# Patient Record
Sex: Female | Born: 1965
Health system: Southern US, Community
[De-identification: ages and names within clinical notes are randomized; demographics above are authoritative.]

## PROBLEM LIST (undated history)

## (undated) DIAGNOSIS — R7611 Nonspecific reaction to tuberculin skin test without active tuberculosis: Secondary | ICD-10-CM

## (undated) DIAGNOSIS — I10 Essential (primary) hypertension: Secondary | ICD-10-CM

## (undated) DIAGNOSIS — G4700 Insomnia, unspecified: Secondary | ICD-10-CM

## (undated) DIAGNOSIS — M5030 Other cervical disc degeneration, unspecified cervical region: Secondary | ICD-10-CM

## (undated) DIAGNOSIS — F331 Major depressive disorder, recurrent, moderate: Secondary | ICD-10-CM

## (undated) DIAGNOSIS — F419 Anxiety disorder, unspecified: Secondary | ICD-10-CM

## (undated) DIAGNOSIS — R413 Other amnesia: Secondary | ICD-10-CM

## (undated) DIAGNOSIS — M329 Systemic lupus erythematosus, unspecified: Secondary | ICD-10-CM

## (undated) DIAGNOSIS — M069 Rheumatoid arthritis, unspecified: Secondary | ICD-10-CM

## (undated) DIAGNOSIS — K5909 Other constipation: Secondary | ICD-10-CM

## (undated) DIAGNOSIS — T7840XA Allergy, unspecified, initial encounter: Secondary | ICD-10-CM

## (undated) DIAGNOSIS — K219 Gastro-esophageal reflux disease without esophagitis: Secondary | ICD-10-CM

## (undated) DIAGNOSIS — D649 Anemia, unspecified: Secondary | ICD-10-CM

## (undated) DIAGNOSIS — B360 Pityriasis versicolor: Secondary | ICD-10-CM

## (undated) HISTORY — DX: Gastro-esophageal reflux disease without esophagitis: K21.9

## (undated) HISTORY — DX: Other cervical disc degeneration, unspecified cervical region: M50.30

## (undated) HISTORY — DX: Insomnia, unspecified: G47.00

## (undated) HISTORY — DX: Systemic lupus erythematosus, unspecified: M32.9

## (undated) HISTORY — PX: RENAL BIOPSY: SHX156

## (undated) HISTORY — DX: Other constipation: K59.09

## (undated) HISTORY — DX: Pityriasis versicolor: B36.0

## (undated) HISTORY — DX: Major depressive disorder, recurrent, moderate: F33.1

## (undated) HISTORY — DX: Nonspecific reaction to tuberculin skin test without active tuberculosis: R76.11

## (undated) HISTORY — PX: UPPER GASTROINTESTINAL ENDOSCOPY: SHX188

## (undated) HISTORY — DX: Anxiety disorder, unspecified: F41.9

## (undated) HISTORY — PX: TONSILLECTOMY: SUR1361

## (undated) HISTORY — DX: Allergy, unspecified, initial encounter: T78.40XA

## (undated) HISTORY — DX: Other amnesia: R41.3

---

## 1998-08-25 DIAGNOSIS — A159 Respiratory tuberculosis unspecified: Secondary | ICD-10-CM

## 1998-08-25 DIAGNOSIS — G039 Meningitis, unspecified: Secondary | ICD-10-CM

## 1998-08-25 HISTORY — DX: Meningitis, unspecified: G03.9

## 1998-08-25 HISTORY — DX: Respiratory tuberculosis unspecified: A15.9

## 2005-01-30 ENCOUNTER — Other Ambulatory Visit: Admission: RE | Admit: 2005-01-30 | Discharge: 2005-01-30 | Payer: Self-pay | Admitting: Gynecology

## 2005-05-16 ENCOUNTER — Emergency Department (HOSPITAL_COMMUNITY): Admission: EM | Admit: 2005-05-16 | Discharge: 2005-05-16 | Payer: Self-pay | Admitting: Emergency Medicine

## 2005-07-07 ENCOUNTER — Emergency Department (HOSPITAL_COMMUNITY): Admission: EM | Admit: 2005-07-07 | Discharge: 2005-07-07 | Payer: Self-pay | Admitting: *Deleted

## 2007-08-26 HISTORY — PX: RENAL BIOPSY: SHX156

## 2009-12-18 HISTORY — PX: UPPER GASTROINTESTINAL ENDOSCOPY: SHX188

## 2012-03-23 ENCOUNTER — Ambulatory Visit: Payer: Medicaid Other | Attending: Neurology | Admitting: Rehabilitation

## 2012-03-23 DIAGNOSIS — M2560 Stiffness of unspecified joint, not elsewhere classified: Secondary | ICD-10-CM | POA: Insufficient documentation

## 2012-03-23 DIAGNOSIS — IMO0001 Reserved for inherently not codable concepts without codable children: Secondary | ICD-10-CM | POA: Insufficient documentation

## 2012-03-23 DIAGNOSIS — M2550 Pain in unspecified joint: Secondary | ICD-10-CM | POA: Insufficient documentation

## 2012-03-23 DIAGNOSIS — R293 Abnormal posture: Secondary | ICD-10-CM | POA: Insufficient documentation

## 2012-03-29 ENCOUNTER — Ambulatory Visit: Payer: Medicaid Other | Attending: Neurology | Admitting: Physical Therapy

## 2012-03-29 DIAGNOSIS — R293 Abnormal posture: Secondary | ICD-10-CM | POA: Insufficient documentation

## 2012-03-29 DIAGNOSIS — M2550 Pain in unspecified joint: Secondary | ICD-10-CM | POA: Insufficient documentation

## 2012-03-29 DIAGNOSIS — IMO0001 Reserved for inherently not codable concepts without codable children: Secondary | ICD-10-CM | POA: Insufficient documentation

## 2012-03-29 DIAGNOSIS — M2560 Stiffness of unspecified joint, not elsewhere classified: Secondary | ICD-10-CM | POA: Insufficient documentation

## 2012-04-13 ENCOUNTER — Ambulatory Visit: Payer: Medicaid Other | Admitting: Rehabilitation

## 2012-05-04 ENCOUNTER — Ambulatory Visit: Payer: Medicaid Other | Attending: Neurology | Admitting: Rehabilitation

## 2012-05-04 DIAGNOSIS — M2550 Pain in unspecified joint: Secondary | ICD-10-CM | POA: Insufficient documentation

## 2012-05-04 DIAGNOSIS — IMO0001 Reserved for inherently not codable concepts without codable children: Secondary | ICD-10-CM | POA: Insufficient documentation

## 2012-05-04 DIAGNOSIS — M2560 Stiffness of unspecified joint, not elsewhere classified: Secondary | ICD-10-CM | POA: Insufficient documentation

## 2012-05-04 DIAGNOSIS — R293 Abnormal posture: Secondary | ICD-10-CM | POA: Insufficient documentation

## 2013-04-06 ENCOUNTER — Other Ambulatory Visit: Payer: Self-pay | Admitting: Internal Medicine

## 2013-04-06 DIAGNOSIS — Z1231 Encounter for screening mammogram for malignant neoplasm of breast: Secondary | ICD-10-CM

## 2013-06-08 ENCOUNTER — Emergency Department (HOSPITAL_COMMUNITY)
Admission: EM | Admit: 2013-06-08 | Discharge: 2013-06-08 | Disposition: A | Discharge status: Home / Self Care | Payer: PRIVATE HEALTH INSURANCE | Attending: Emergency Medicine | Admitting: Emergency Medicine

## 2013-06-08 ENCOUNTER — Emergency Department (HOSPITAL_COMMUNITY): Payer: PRIVATE HEALTH INSURANCE

## 2013-06-08 ENCOUNTER — Encounter (HOSPITAL_COMMUNITY): Payer: Self-pay | Admitting: Emergency Medicine

## 2013-06-08 DIAGNOSIS — F329 Major depressive disorder, single episode, unspecified: Secondary | ICD-10-CM | POA: Insufficient documentation

## 2013-06-08 DIAGNOSIS — Z8739 Personal history of other diseases of the musculoskeletal system and connective tissue: Secondary | ICD-10-CM | POA: Insufficient documentation

## 2013-06-08 DIAGNOSIS — M069 Rheumatoid arthritis, unspecified: Secondary | ICD-10-CM | POA: Insufficient documentation

## 2013-06-08 DIAGNOSIS — R0789 Other chest pain: Secondary | ICD-10-CM

## 2013-06-08 DIAGNOSIS — R071 Chest pain on breathing: Secondary | ICD-10-CM | POA: Insufficient documentation

## 2013-06-08 DIAGNOSIS — F3289 Other specified depressive episodes: Secondary | ICD-10-CM | POA: Insufficient documentation

## 2013-06-08 DIAGNOSIS — Z87891 Personal history of nicotine dependence: Secondary | ICD-10-CM | POA: Insufficient documentation

## 2013-06-08 DIAGNOSIS — Z880 Allergy status to penicillin: Secondary | ICD-10-CM | POA: Insufficient documentation

## 2013-06-08 DIAGNOSIS — Z3202 Encounter for pregnancy test, result negative: Secondary | ICD-10-CM | POA: Insufficient documentation

## 2013-06-08 DIAGNOSIS — Z79899 Other long term (current) drug therapy: Secondary | ICD-10-CM | POA: Insufficient documentation

## 2013-06-08 HISTORY — DX: Rheumatoid arthritis, unspecified: M06.9

## 2013-06-08 HISTORY — DX: Systemic lupus erythematosus, unspecified: M32.9

## 2013-06-08 LAB — CBC
Hemoglobin: 11.6 g/dL — ABNORMAL LOW (ref 12.0–15.0)
MCH: 28 pg (ref 26.0–34.0)
MCHC: 32.4 g/dL (ref 30.0–36.0)
Platelets: 166 10*3/uL (ref 150–400)
RDW: 13.6 % (ref 11.5–15.5)
WBC: 6.1 10*3/uL (ref 4.0–10.5)

## 2013-06-08 LAB — BASIC METABOLIC PANEL
CO2: 26 mEq/L (ref 19–32)
Calcium: 9.5 mg/dL (ref 8.4–10.5)
GFR calc Af Amer: >90 mL/min (ref >90)
GFR calc non Af Amer: >90 mL/min (ref >90)
Glucose, Bld: 77 mg/dL (ref 70–99)
Potassium: 3.9 mEq/L (ref 3.5–5.1)
Sodium: 136 mEq/L (ref 135–145)

## 2013-06-08 LAB — POCT PREGNANCY, URINE: Preg Test, Ur: NEGATIVE

## 2013-06-08 LAB — POCT I-STAT TROPONIN I

## 2013-06-08 MED ORDER — MORPHINE SULFATE 4 MG/ML IJ SOLN
4.0000 mg | Freq: Once | INTRAMUSCULAR | Status: AC
  Administered 2013-06-08: 4 mg via INTRAVENOUS
  Filled 2013-06-08: qty 1

## 2013-06-08 MED ORDER — IBUPROFEN 800 MG PO TABS: 800.0000 mg | ORAL_TABLET | Freq: Three times a day (TID) | ORAL | Status: DC

## 2013-06-08 MED ORDER — HYDROCODONE-ACETAMINOPHEN 5-325 MG PO TABS: 1.0000 | ORAL_TABLET | ORAL | Status: DC | PRN

## 2013-06-08 NOTE — ED Notes (Signed)
Pt states chest pain starting yesterday with progressing severity.  No also has pain in right shoulder blade.  Pt states some increase in pain with movement.

## 2013-06-08 NOTE — ED Notes (Signed)
Pt stated being under a lot of stress, pain started last night, she took ibuprofen and minimal relief. Denies any N/V, dizziness and SOB. HX of lupus, RA.

## 2013-06-08 NOTE — ED Provider Notes (Signed)
Medical screening examination/treatment/procedure(s) were performed by non-physician practitioner and as supervising physician I was immediately available for consultation/collaboration.  Sly Parlee L Saretta Dahlem, MD 06/08/13 2335 

## 2013-06-08 NOTE — ED Provider Notes (Signed)
CSN: [id number]     Arrival date & time 06/08/13  1710 History   First MD Initiated Contact with Patient 06/08/13 2055     Chief Complaint  Patient presents with  . Chest Pain   (Consider location/radiation/quality/duration/timing/severity/associated sxs/prior Treatment) Patient is a 47 y.o. female presenting with chest pain. The history is provided by the patient. No language interpreter was used.  Chest Pain Pain location:  Substernal area Pain quality: aching   Pain radiates to:  R arm Pain radiates to the back: no   Pain severity:  Moderate Onset quality:  Gradual Associated symptoms: no cough, no fever, no nausea and no weakness   Associated symptoms comment:  Chest pain that started yesterday and has been progressive in intensity and constant. It is worse with movement. She denies significant shortness of breath, cough, fever, or nausea. No known injury.    Past Medical History  Diagnosis Date  . RA (rheumatoid arthritis)   . Depression   . Lupus    Past Surgical History  Procedure Laterality Date  . Renal biopsy Left 5 years prior   No family history on file. History  Substance Use Topics  . Smoking status: Former Smoker    Quit date: 06/09/2011  . Smokeless tobacco: Not on file  . Alcohol Use: 1.2 oz/week    2 Glasses of wine per week   OB History   Grav Para Term Preterm Abortions TAB SAB Ect Mult Living                 Review of Systems  Constitutional: Negative for fever and chills.  HENT: Negative.   Respiratory: Negative.  Negative for cough.   Cardiovascular: Positive for chest pain. Negative for leg swelling.  Gastrointestinal: Negative.  Negative for nausea.  Genitourinary: Negative.  Negative for dysuria.  Musculoskeletal: Negative.   Skin: Negative.   Neurological: Negative.  Negative for weakness.    Allergies  Peanuts; Penicillins; Soy allergy; and Sulfa antibiotics  Home Medications   Current Outpatient Rx  Name  Route  Sig  Dispense   Refill  . desvenlafaxine (PRISTIQ) 100 MG 24 hr tablet   Oral   Take 100 mg by mouth daily.         Marland Kitchen gabapentin (NEURONTIN) 300 MG capsule   Oral   Take 300 mg by mouth 3 (three) times daily.         Marland Kitchen ibuprofen (ADVIL,MOTRIN) 200 MG tablet   Oral   Take 400 mg by mouth every 6 (six) hours as needed for pain.         . methotrexate (RHEUMATREX) 2.5 MG tablet   Oral   Take 15 mg by mouth once a week. Caution:Chemotherapy. Protect from light.         . montelukast (SINGULAIR) 10 MG tablet   Oral   Take 10 mg by mouth at bedtime.         . traZODone (DESYREL) 100 MG tablet   Oral   Take 100 mg by mouth at bedtime.          BP 120/80  Pulse 66  Temp(Src) 97.6 F (36.4 C) (Oral)  Resp 13  SpO2 100%  LMP 05/14/2013 Physical Exam  Constitutional: She is oriented to person, place, and time. She appears well-developed and well-nourished.  HENT:  Head: Normocephalic.  Neck: Normal range of motion. Neck supple.  Cardiovascular: Normal rate and regular rhythm.   Pulmonary/Chest: Effort normal and breath sounds normal. She exhibits  tenderness.  Abdominal: Soft. Bowel sounds are normal. There is no tenderness. There is no rebound and no guarding.  Musculoskeletal: Normal range of motion. She exhibits no edema.  Neurological: She is alert and oriented to person, place, and time.  Skin: Skin is warm and dry. No rash noted.  Psychiatric: She has a normal mood and affect.    ED Course  Procedures (including critical care time) Labs Review Labs Reviewed  CBC - Abnormal; Notable for the following:    Hemoglobin 11.6 (*)    HCT 35.8 (*)    All other components within normal limits  BASIC METABOLIC PANEL  POCT I-STAT TROPONIN I  POCT I-STAT TROPONIN I  POCT PREGNANCY, URINE   Results for orders placed during the hospital encounter of 06/08/13  CBC      Result Value Range   WBC 6.1  4.0 - 10.5 K/uL   RBC 4.15  3.87 - 5.11 MIL/uL   Hemoglobin 11.6 (*) 12.0 -  15.0 g/dL   HCT 56.2 (*) 13.0 - 86.5 %   MCV 86.3  78.0 - 100.0 fL   MCH 28.0  26.0 - 34.0 pg   MCHC 32.4  30.0 - 36.0 g/dL   RDW 78.4  69.6 - 29.5 %   Platelets 166  150 - 400 K/uL  BASIC METABOLIC PANEL      Result Value Range   Sodium 136  135 - 145 mEq/L   Potassium 3.9  3.5 - 5.1 mEq/L   Chloride 102  96 - 112 mEq/L   CO2 26  19 - 32 mEq/L   Glucose, Bld 77  70 - 99 mg/dL   BUN 14  6 - 23 mg/dL   Creatinine, Ser 2.84  0.50 - 1.10 mg/dL   Calcium 9.5  8.4 - 13.2 mg/dL   GFR calc non Af Amer >90  >90 mL/min   GFR calc Af Amer >90  >90 mL/min  POCT I-STAT TROPONIN I      Result Value Range   Troponin i, poc 0.00  0.00 - 0.08 ng/mL   Comment 3           POCT I-STAT TROPONIN I      Result Value Range   Troponin i, poc 0.00  0.00 - 0.08 ng/mL   Comment 3           POCT PREGNANCY, URINE      Result Value Range   Preg Test, Ur NEGATIVE  NEGATIVE    Imaging Review Dg Chest 2 View  06/08/2013   CLINICAL DATA:  chest pain  EXAM: CHEST  2 VIEW  COMPARISON:  Report of prior chest radiograph May 09, 2011 available. Actual images from that study not available.  FINDINGS: Lungs clear. Heart size and pulmonary vascularity are normal. No adenopathy. No pneumothorax. No bone lesions.  IMPRESSION: No abnormality noted.   Electronically Signed   By: Bretta Bang M.D.   On: 06/08/2013 18:34    EKG Interpretation     Ventricular Rate:  78 PR Interval:  140 QRS Duration: 74 QT Interval:  370 QTC Calculation: 421 R Axis:   82 Text Interpretation:  Normal sinus rhythm Normal ECG No old tracing to compare            MDM  No diagnosis found. 1. Chest wall pain  Patient has very low risk for cardiac disease (no FH, nonsmoker, no h/o HTN or cholesterol) and pain is atypical and very reproducible on exam.  Normal vitals.  Neg lab studies including troponin x 2. Stable for discharge.     Arnoldo Hooker, PA-C 06/08/13 2224  Arnoldo Hooker, PA-C 06/08/13 2239

## 2013-10-17 ENCOUNTER — Emergency Department (INDEPENDENT_AMBULATORY_CARE_PROVIDER_SITE_OTHER)
Admission: EM | Admit: 2013-10-17 | Discharge: 2013-10-17 | Disposition: A | Discharge status: Other Acute Inpatient Hospital | Payer: PRIVATE HEALTH INSURANCE | Source: Home / Self Care | Attending: Family Medicine | Admitting: Family Medicine

## 2013-10-17 ENCOUNTER — Encounter (HOSPITAL_COMMUNITY): Payer: Self-pay | Admitting: Emergency Medicine

## 2013-10-17 DIAGNOSIS — M069 Rheumatoid arthritis, unspecified: Secondary | ICD-10-CM | POA: Insufficient documentation

## 2013-10-17 DIAGNOSIS — F3289 Other specified depressive episodes: Secondary | ICD-10-CM | POA: Insufficient documentation

## 2013-10-17 DIAGNOSIS — Z8661 Personal history of infections of the central nervous system: Secondary | ICD-10-CM

## 2013-10-17 DIAGNOSIS — F329 Major depressive disorder, single episode, unspecified: Secondary | ICD-10-CM | POA: Insufficient documentation

## 2013-10-17 DIAGNOSIS — Z79899 Other long term (current) drug therapy: Secondary | ICD-10-CM | POA: Insufficient documentation

## 2013-10-17 DIAGNOSIS — R519 Headache, unspecified: Secondary | ICD-10-CM

## 2013-10-17 DIAGNOSIS — R51 Headache: Secondary | ICD-10-CM

## 2013-10-17 DIAGNOSIS — Z880 Allergy status to penicillin: Secondary | ICD-10-CM | POA: Insufficient documentation

## 2013-10-17 DIAGNOSIS — Z8739 Personal history of other diseases of the musculoskeletal system and connective tissue: Secondary | ICD-10-CM | POA: Insufficient documentation

## 2013-10-17 DIAGNOSIS — Z3202 Encounter for pregnancy test, result negative: Secondary | ICD-10-CM | POA: Insufficient documentation

## 2013-10-17 DIAGNOSIS — IMO0002 Reserved for concepts with insufficient information to code with codable children: Secondary | ICD-10-CM | POA: Insufficient documentation

## 2013-10-17 DIAGNOSIS — R000 Tachycardia, unspecified: Secondary | ICD-10-CM | POA: Insufficient documentation

## 2013-10-17 DIAGNOSIS — Z87891 Personal history of nicotine dependence: Secondary | ICD-10-CM | POA: Insufficient documentation

## 2013-10-17 LAB — COMPREHENSIVE METABOLIC PANEL
ALBUMIN: 4.2 g/dL (ref 3.5–5.2)
ALK PHOS: 76 U/L (ref 39–117)
ALT: 18 U/L (ref 0–35)
AST: 20 U/L (ref 0–37)
BILIRUBIN TOTAL: 0.2 mg/dL — AB (ref 0.3–1.2)
BUN: 18 mg/dL (ref 6–23)
CO2: 28 mEq/L (ref 19–32)
Calcium: 9.7 mg/dL (ref 8.4–10.5)
Chloride: 99 mEq/L (ref 96–112)
Creatinine, Ser: 0.74 mg/dL (ref 0.50–1.10)
GFR calc Af Amer: >90 mL/min (ref >90)
GFR calc non Af Amer: >90 mL/min (ref >90)
Glucose, Bld: 102 mg/dL — ABNORMAL HIGH (ref 70–99)
Potassium: 4.3 mEq/L (ref 3.7–5.3)
SODIUM: 139 meq/L (ref 137–147)
TOTAL PROTEIN: 8.2 g/dL (ref 6.0–8.3)

## 2013-10-17 LAB — CBC WITH DIFFERENTIAL/PLATELET
BASOS PCT: 0 % (ref 0–1)
Basophils Absolute: 0 10*3/uL (ref 0.0–0.1)
EOS ABS: 0 10*3/uL (ref 0.0–0.7)
Eosinophils Relative: 0 % (ref 0–5)
HCT: 39.4 % (ref 36.0–46.0)
Hemoglobin: 12.9 g/dL (ref 12.0–15.0)
Lymphocytes Relative: 11 % — ABNORMAL LOW (ref 12–46)
Lymphs Abs: 1.3 10*3/uL (ref 0.7–4.0)
MCH: 29 pg (ref 26.0–34.0)
MCHC: 32.7 g/dL (ref 30.0–36.0)
MCV: 88.5 fL (ref 78.0–100.0)
Monocytes Absolute: 0.5 10*3/uL (ref 0.1–1.0)
Monocytes Relative: 5 % (ref 3–12)
NEUTROS ABS: 9.6 10*3/uL — AB (ref 1.7–7.7)
NEUTROS PCT: 84 % — AB (ref 43–77)
Platelets: 170 10*3/uL (ref 150–400)
RBC: 4.45 MIL/uL (ref 3.87–5.11)
RDW: 13.4 % (ref 11.5–15.5)
WBC: 11.4 10*3/uL — ABNORMAL HIGH (ref 4.0–10.5)

## 2013-10-17 LAB — URINALYSIS, ROUTINE W REFLEX MICROSCOPIC
BILIRUBIN URINE: NEGATIVE
GLUCOSE, UA: NEGATIVE mg/dL
HGB URINE DIPSTICK: NEGATIVE
KETONES UR: NEGATIVE mg/dL
Leukocytes, UA: NEGATIVE
Nitrite: NEGATIVE
PH: 7 (ref 5.0–8.0)
Protein, ur: NEGATIVE mg/dL
SPECIFIC GRAVITY, URINE: 1.025 (ref 1.005–1.030)
Urobilinogen, UA: 1 mg/dL (ref 0.0–1.0)

## 2013-10-17 LAB — POC URINE PREG, ED: Preg Test, Ur: NEGATIVE

## 2013-10-17 NOTE — ED Provider Notes (Signed)
Bonnie Farmer is a 48 y.o. female who presents to Urgent Care today for headache. Patient has a past medical history significant for meningitis, rheumatoid arthritis, and lupus. She developed a severe headache this morning that has persisted. It is associated with mild photophobia. The headache is severe it consistent with prior episodes of meningitis. 10 years ago she developed a viral meningitis from an unknown source possibly to be related to a mosquito borne illness from [place]. Additionally with her severe headache she has developed at the aches, neck pain and stiffness. She denies any weakness or numbness or loss of function disc coordination etc. She subjectively feels like she is thinking well. She denies any nausea vomiting or diarrhea. She does note a mild fever in addition to her headache and neck stiffness. She took 2 Aleve and 2 ibuprofen this afternoon.    Past Medical History  Diagnosis Date  . RA (rheumatoid arthritis)   . Depression   . Lupus    History  Substance Use Topics  . Smoking status: Former Smoker    Quit date: 06/09/2011  . Smokeless tobacco: Not on file  . Alcohol Use: 1.2 oz/week    2 Glasses of wine per week   ROS as above Medications: No current facility-administered medications for this encounter.   Current Outpatient Prescriptions  Medication Sig Dispense Refill  . desvenlafaxine (PRISTIQ) 100 MG 24 hr tablet Take 100 mg by mouth daily.      Marland Kitchen gabapentin (NEURONTIN) 300 MG capsule Take 300 mg by mouth 3 (three) times daily.      Marland Kitchen ibuprofen (ADVIL,MOTRIN) 200 MG tablet Take 400 mg by mouth every 6 (six) hours as needed for pain.      Marland Kitchen ibuprofen (ADVIL,MOTRIN) 800 MG tablet Take 1 tablet (800 mg total) by mouth 3 (three) times daily.  21 tablet  0  . montelukast (SINGULAIR) 10 MG tablet Take 10 mg by mouth at bedtime.      Marland Kitchen HYDROcodone-acetaminophen (NORCO/VICODIN) 5-325 MG per tablet Take 1-2 tablets by mouth every 4 (four) hours as needed for pain.   10 tablet  0  . methotrexate (RHEUMATREX) 2.5 MG tablet Take 15 mg by mouth once a week. Caution:Chemotherapy. Protect from light.      . traZODone (DESYREL) 100 MG tablet Take 100 mg by mouth at bedtime.        Exam:  BP 117/76  Pulse 67  Temp(Src) 99.2 F (37.3 C) (Oral)  Resp 16  SpO2 98%  LMP 10/08/2013 Gen: Well NAD HEENT: EOMI,  MMM PERRLA. Funduscopic exam is normal bilaterally. Exam is slightly degraded by cataract flare Neck: Nontender to spinal midline. Tender palpation right trapezius. Decreased flexion secondary to pain. Normal rotation and extension. Negative Kernig sign. Lungs: Normal work of breathing. CTABL Heart: RRR no MRG Abd: NABS, Soft. NT, ND Exts: Brisk capillary refill, warm and well perfused.  Neuro: Alert and oriented. Cranial nerves are intact. Normal balance and coordination. Sensation and strength are intact. Normal gait.   Assessment and Plan: 48 y.o. female with headache, neck stiffness, and mild fever. Patient has a history of viral meningitis and is immunocompromised due to prednisone and methotrexate secondary to rheumatoid arthritis.  Her fever is mild however this may be suppressed given her recent NSAID use. Her neck stiffness is probably more related to muscular dysfunction then meningeal signs over unable to fully assess this at the [facility] urgent.  I believe the patient needs more evaluation and management they were able  to perform at the urgent care setting. Transfer to the emergency room via shuttle.      Gregor Hams, MD 10/17/13 [phone]

## 2013-10-17 NOTE — ED Notes (Signed)
Pt reports headache, joint pain, limited movement in neck due to pain, and fever starting last night. Pt was seen at urgent care and sent here for further evaluation. Pt has hx of meningitis 20yrs ago. Pt last took ibuprofen at 10am. Denies light sensitivity.

## 2013-10-17 NOTE — ED Notes (Signed)
C/o onset this AM of headache that will not go away, fever, pain in neck and joints. History of RA and meningitis

## 2013-10-18 ENCOUNTER — Emergency Department (HOSPITAL_COMMUNITY): Payer: PRIVATE HEALTH INSURANCE

## 2013-10-18 ENCOUNTER — Emergency Department (HOSPITAL_COMMUNITY)
Admission: EM | Admit: 2013-10-18 | Discharge: 2013-10-18 | Disposition: A | Discharge status: Home / Self Care | Payer: PRIVATE HEALTH INSURANCE | Attending: Emergency Medicine | Admitting: Emergency Medicine

## 2013-10-18 DIAGNOSIS — R51 Headache: Secondary | ICD-10-CM

## 2013-10-18 DIAGNOSIS — R000 Tachycardia, unspecified: Secondary | ICD-10-CM

## 2013-10-18 DIAGNOSIS — R519 Headache, unspecified: Secondary | ICD-10-CM

## 2013-10-18 MED ORDER — METOCLOPRAMIDE HCL 5 MG/ML IJ SOLN
10.0000 mg | Freq: Once | INTRAMUSCULAR | Status: AC
  Administered 2013-10-18: 10 mg via INTRAVENOUS
  Filled 2013-10-18: qty 2

## 2013-10-18 MED ORDER — SODIUM CHLORIDE 0.9 % IV BOLUS (SEPSIS)
1000.0000 mL | Freq: Once | INTRAVENOUS | Status: AC
  Administered 2013-10-18: 1000 mL via INTRAVENOUS

## 2013-10-18 MED ORDER — DOCUSATE SODIUM 100 MG PO CAPS: 100.0000 mg | ORAL_CAPSULE | Freq: Two times a day (BID) | ORAL | Status: DC

## 2013-10-18 MED ORDER — FENTANYL CITRATE 0.05 MG/ML IJ SOLN
50.0000 ug | Freq: Once | INTRAMUSCULAR | Status: AC
  Administered 2013-10-18: 50 ug via INTRAVENOUS
  Filled 2013-10-18: qty 2

## 2013-10-18 MED ORDER — KETOROLAC TROMETHAMINE 30 MG/ML IJ SOLN
30.0000 mg | Freq: Once | INTRAMUSCULAR | Status: AC
  Administered 2013-10-18: 30 mg via INTRAVENOUS
  Filled 2013-10-18: qty 1

## 2013-10-18 MED ORDER — FENTANYL CITRATE 0.05 MG/ML IJ SOLN: 50.0000 ug | INTRAMUSCULAR | Status: DC | PRN

## 2013-10-18 MED ORDER — HYDROCODONE-ACETAMINOPHEN 5-325 MG PO TABS: 1.0000 | ORAL_TABLET | ORAL | Status: DC | PRN

## 2013-10-18 NOTE — Discharge Instructions (Signed)
PLEASE RETURN PROMPTLY TO THE ED IF YOU HAVE FEVER, WORSENING SYMPTOMS OR ANY OTHER NEUROLOGIC SYMPTOMS.

## 2013-10-18 NOTE — ED Provider Notes (Signed)
CSN: [id number]     Arrival date & time 10/17/13  1748 History   First MD Initiated Contact with Patient 10/18/13 0055     Chief Complaint  Patient presents with  . Headache     (Consider location/radiation/quality/duration/timing/severity/associated sxs/prior Treatment) HPI Patient is a 48 yo woman with RA who says that she is told that she has a history of SLE but that she does not have SLE. She is being treated with prednisone 10mg  po qd and methotrexate "because we are trying to get it under control". She is here today with complaint of headache.   Her headache began the evening of 2/22 - approx 48 hrs ago and has been constant. Pain is present throughout the head. Patient also reports neck back along with pain in most of her major joints. Her shoulders feel tight. She has aching knee pain. She says it hurts to flex her neck. She says that she had an episode of sweats last night but has not checked her temp with a thermometer. She rates her pain 8.5/10.   Patient notes that she has a history of viral meningitis x 2 and that her sx feel like previous episode of meningitis.     Past Medical History  Diagnosis Date  . RA (rheumatoid arthritis)   . Depression   . Lupus    Past Surgical History  Procedure Laterality Date  . Renal biopsy Left 5 years prior   History reviewed. No pertinent family history. History  Substance Use Topics  . Smoking status: Former Smoker    Quit date: 06/09/2011  . Smokeless tobacco: Not on file  . Alcohol Use: 1.2 oz/week    2 Glasses of wine per week   OB History   Grav Para Term Preterm Abortions TAB SAB Ect Mult Living                 Review of Systems Ten point review of symptoms performed and is negative with the exception of symptoms noted above.     Allergies  Peanuts; Penicillins; Soy allergy; and Sulfa antibiotics  Home Medications   Current Outpatient Rx  Name  Route  Sig  Dispense  Refill  . cetirizine (ZYRTEC) 10 MG  tablet   Oral   Take 10 mg by mouth daily.         Marland Kitchen desvenlafaxine (PRISTIQ) 100 MG 24 hr tablet   Oral   Take 100 mg by mouth daily.         . folic acid (FOLVITE) 867 MCG tablet   Oral   Take 800 mcg by mouth daily.         Marland Kitchen gabapentin (NEURONTIN) 300 MG capsule   Oral   Take 300 mg by mouth at bedtime.          . methotrexate (RHEUMATREX) 2.5 MG tablet   Oral   Take 25 mg by mouth every Friday. Caution:Chemotherapy. Protect from light.         . montelukast (SINGULAIR) 10 MG tablet   Oral   Take 10 mg by mouth at bedtime.         . predniSONE (DELTASONE) 10 MG tablet   Oral   Take 10 mg by mouth daily with breakfast.         . traZODone (DESYREL) 100 MG tablet   Oral   Take 100 mg by mouth at bedtime.          BP 132/75  Pulse 112  Temp(Src)  99.3 F (37.4 C) (Oral)  Resp 19  SpO2 99%  LMP 10/08/2013 Physical Exam Gen: well developed and well nourished appearing Head: NCAT Eyes: PERL, EOMI Nose: no epistaixis or rhinorrhea Mouth/throat: mucosa is moist and pink Neck: supple, there is tenderness to palpation of the paraspinal musculature in the posterior cervical region, patient can flex her neck Lungs: Respirations 20 per minute CTA B, no wheezing, rhonchi or rales CV: Rapid and regular with pulse 104 beats per minute, no murmur, extremities appear well perfused.  Abd: soft, notender, nondistended Back: no ttp, no cva ttp Skin: warm and dry Ext: normal to inspection, no dependent edema Neuro: CN ii-xii grossly intact, no focal deficits Psyche; normal affect,  calm and cooperative.   ED Course  Procedures (including critical care time) Labs Review  Results for orders placed during the hospital encounter of 10/18/13 (from the past 24 hour(s))  CBC WITH DIFFERENTIAL     Status: Abnormal   Collection Time    10/17/13  8:38 PM      Result Value Ref Range   WBC 11.4 (*) 4.0 - 10.5 K/uL   RBC 4.45  3.87 - 5.11 MIL/uL   Hemoglobin 12.9   12.0 - 15.0 g/dL   HCT 39.4  36.0 - 46.0 %   MCV 88.5  78.0 - 100.0 fL   MCH 29.0  26.0 - 34.0 pg   MCHC 32.7  30.0 - 36.0 g/dL   RDW 13.4  11.5 - 15.5 %   Platelets 170  150 - 400 K/uL   Neutrophils Relative % 84 (*) 43 - 77 %   Neutro Abs 9.6 (*) 1.7 - 7.7 K/uL   Lymphocytes Relative 11 (*) 12 - 46 %   Lymphs Abs 1.3  0.7 - 4.0 K/uL   Monocytes Relative 5  3 - 12 %   Monocytes Absolute 0.5  0.1 - 1.0 K/uL   Eosinophils Relative 0  0 - 5 %   Eosinophils Absolute 0.0  0.0 - 0.7 K/uL   Basophils Relative 0  0 - 1 %   Basophils Absolute 0.0  0.0 - 0.1 K/uL  COMPREHENSIVE METABOLIC PANEL     Status: Abnormal   Collection Time    10/17/13  8:38 PM      Result Value Ref Range   Sodium 139  137 - 147 mEq/L   Potassium 4.3  3.7 - 5.3 mEq/L   Chloride 99  96 - 112 mEq/L   CO2 28  19 - 32 mEq/L   Glucose, Bld 102 (*) 70 - 99 mg/dL   BUN 18  6 - 23 mg/dL   Creatinine, Ser 0.74  0.50 - 1.10 mg/dL   Calcium 9.7  8.4 - 10.5 mg/dL   Total Protein 8.2  6.0 - 8.3 g/dL   Albumin 4.2  3.5 - 5.2 g/dL   AST 20  0 - 37 U/L   ALT 18  0 - 35 U/L   Alkaline Phosphatase 76  39 - 117 U/L   Total Bilirubin 0.2 (*) 0.3 - 1.2 mg/dL   GFR calc non Af Amer >90  >90 mL/min   GFR calc Af Amer >90  >90 mL/min  POC URINE PREG, ED     Status: None   Collection Time    10/17/13  9:46 PM      Result Value Ref Range   Preg Test, Ur NEGATIVE  NEGATIVE  URINALYSIS, ROUTINE W REFLEX MICROSCOPIC     Status: Abnormal  Collection Time    10/17/13  9:47 PM      Result Value Ref Range   Color, Urine YELLOW  YELLOW   APPearance HAZY (*) CLEAR   Specific Gravity, Urine 1.025  1.005 - 1.030   pH 7.0  5.0 - 8.0   Glucose, UA NEGATIVE  NEGATIVE mg/dL   Hgb urine dipstick NEGATIVE  NEGATIVE   Bilirubin Urine NEGATIVE  NEGATIVE   Ketones, ur NEGATIVE  NEGATIVE mg/dL   Protein, ur NEGATIVE  NEGATIVE mg/dL   Urobilinogen, UA 1.0  0.0 - 1.0 mg/dL   Nitrite NEGATIVE  NEGATIVE   Leukocytes, UA NEGATIVE  NEGATIVE     CT head: NAICP.   MDM    Patient with headache - improved with Fentanyl. No neurologic deficits. Normal head CT. NO fever. Peristently midly tachycardic which may be secondary to prednisone use. The patient is noted to have a mild leukocytosis which is also likely secondary to prednisone. I do not feel that we need to rule out bacterial meningitis.  I have discussed option of LP to evaluate for viral meningitis and the patient declines this procedure. She says she wishes to go home. She agrees to return for fever or worsening sx and to f/u with her PCP in the next 24 hrs.      Elyn Peers, MD 10/18/13 [phone]

## 2013-10-18 NOTE — ED Notes (Signed)
Patient currently waiting med time until able to discharge.

## 2014-06-14 ENCOUNTER — Other Ambulatory Visit: Payer: Self-pay

## 2014-06-14 DIAGNOSIS — Z1239 Encounter for other screening for malignant neoplasm of breast: Secondary | ICD-10-CM

## 2014-06-19 ENCOUNTER — Encounter (INDEPENDENT_AMBULATORY_CARE_PROVIDER_SITE_OTHER): Payer: Managed Care, Other (non HMO) | Admitting: Ophthalmology

## 2014-06-19 DIAGNOSIS — H3540 Unspecified peripheral retinal degeneration: Secondary | ICD-10-CM

## 2014-06-19 DIAGNOSIS — H33302 Unspecified retinal break, left eye: Secondary | ICD-10-CM

## 2014-06-19 DIAGNOSIS — H43813 Vitreous degeneration, bilateral: Secondary | ICD-10-CM

## 2014-07-03 ENCOUNTER — Ambulatory Visit (INDEPENDENT_AMBULATORY_CARE_PROVIDER_SITE_OTHER): Payer: Managed Care, Other (non HMO) | Admitting: Ophthalmology

## 2014-07-03 DIAGNOSIS — H33302 Unspecified retinal break, left eye: Secondary | ICD-10-CM

## 2014-07-10 ENCOUNTER — Other Ambulatory Visit: Payer: Self-pay

## 2014-07-10 DIAGNOSIS — Z1231 Encounter for screening mammogram for malignant neoplasm of breast: Secondary | ICD-10-CM

## 2014-07-26 ENCOUNTER — Ambulatory Visit: Payer: PRIVATE HEALTH INSURANCE

## 2014-11-06 ENCOUNTER — Ambulatory Visit (INDEPENDENT_AMBULATORY_CARE_PROVIDER_SITE_OTHER): Payer: Managed Care, Other (non HMO) | Admitting: Ophthalmology

## 2015-06-07 ENCOUNTER — Ambulatory Visit: Payer: Managed Care, Other (non HMO) | Admitting: Allergy and Immunology

## 2016-01-10 ENCOUNTER — Ambulatory Visit: Payer: PRIVATE HEALTH INSURANCE | Admitting: Physical Therapy

## 2016-04-11 ENCOUNTER — Ambulatory Visit (INDEPENDENT_AMBULATORY_CARE_PROVIDER_SITE_OTHER): Payer: Self-pay | Admitting: Internal Medicine

## 2016-04-11 ENCOUNTER — Encounter: Payer: Self-pay | Admitting: Internal Medicine

## 2016-04-11 VITALS — BP 126/70 | HR 74 | Temp 97.8°F | Resp 16 | Ht 64.75 in | Wt 171.5 lb

## 2016-04-11 DIAGNOSIS — Z79899 Other long term (current) drug therapy: Secondary | ICD-10-CM

## 2016-04-11 DIAGNOSIS — R6882 Decreased libido: Secondary | ICD-10-CM

## 2016-04-11 DIAGNOSIS — F32A Depression, unspecified: Secondary | ICD-10-CM

## 2016-04-11 DIAGNOSIS — R109 Unspecified abdominal pain: Secondary | ICD-10-CM | POA: Insufficient documentation

## 2016-04-11 DIAGNOSIS — J3089 Other allergic rhinitis: Secondary | ICD-10-CM

## 2016-04-11 DIAGNOSIS — M329 Systemic lupus erythematosus, unspecified: Secondary | ICD-10-CM

## 2016-04-11 DIAGNOSIS — F419 Anxiety disorder, unspecified: Secondary | ICD-10-CM

## 2016-04-11 DIAGNOSIS — F329 Major depressive disorder, single episode, unspecified: Secondary | ICD-10-CM

## 2016-04-11 DIAGNOSIS — M069 Rheumatoid arthritis, unspecified: Secondary | ICD-10-CM

## 2016-04-11 LAB — POCT URINALYSIS DIPSTICK
Bilirubin, UA: NEGATIVE
Glucose, UA: NEGATIVE
Ketones, UA: NEGATIVE
Leukocytes, UA: NEGATIVE
Nitrite, UA: NEGATIVE
PH UA: 7
PROTEIN UA: NEGATIVE
SPEC GRAV UA: 1.015
UROBILINOGEN UA: 0.2

## 2016-04-11 NOTE — Patient Instructions (Signed)
Please write down where you are getting your rheumatologic agents and for how much longer  and drop off so can document when they will expire in chart.

## 2016-04-11 NOTE — Progress Notes (Signed)
Subjective:    Patient ID: Bonnie Farmer, female    DOB: July 24, 1966, 50 y.o.   MRN: [id number]  HPI   1.  Stomach Issues:  Problem for many years, at least since young adult.  Remembers going to the hospital in early 20s.  Was "full of stool"  And told they were surprised she wasn't vomiting.  Has a lot of smelly gas and bloating, her leading stool is hard and then soft behind.   During her periods, she develops loose stools.   Feels she also gets significant gas, bloating and explosive stools with milk and ice cream, but does fine with cheese.   No melena or hematochezia.   No weight changes over the years with this concern.   Never with nausea or vomiting. When younger, ate apples and seemed to help.  Not helpful now.  Exercise also was helpful. Has tried Senna, miralax.  Miralax helps, but takes a long time to work and then stool is very loose.   Has tried citrated magnesium as needed. Tried Metamucil and caused a lot of gas.  Maximum 2 weeks.  Went with dosing listed on container from the beginning. Worse with stressful times in her life. Mother with a host of issues--IBS, diverticulosis and worse with stress.  2.  Decreased libido:  Was on Effexor--off for about 5 months. Mainly for her concern with libido. Took Effexor for about 1 year.  Has been on Zoloft, Pristique Does not often have orgasm.  3.  Depression and Anxiety:  May have had hypomanic traits when younger, though was told she did not meet criteria and ultimately "crashed" in mid 63s.  Developed more depressive symptoms since.  Has been on multiple antidepressants in past without manic episode and never on a mood stabilizer. SSRIS, SNRIs did help in the past.    4.  RA:  Diagnosed 4 years ago.  Had SLE diagnosed at 50 yo.  Humira and MTX, but not using the latter regularly.   Current Meds  Medication Sig  . Adalimumab (HUMIRA PEN) 40 MG/0.8ML PNKT Inject 40 mg into the skin once a week.  . Biotin 5 MG CAPS Take 1  capsule by mouth daily.  . fluticasone (FLONASE) 50 MCG/ACT nasal spray Place 2 sprays into both nostrils daily.  Marland Kitchen gabapentin (NEURONTIN) 300 MG capsule Take 300 mg by mouth at bedtime.   . Methotrexate, PF, 10 MG/0.2ML SOAJ Inject 1 mL into the skin once a week.  . montelukast (SINGULAIR) 10 MG tablet Take 10 mg by mouth at bedtime.  . traZODone (DESYREL) 100 MG tablet Take 100 mg by mouth at bedtime.  . [DISCONTINUED] methotrexate (RHEUMATREX) 2.5 MG tablet Take 25 mg by mouth every Friday. Caution:Chemotherapy. Protect from light.   Allergies  Allergen Reactions  . Peanuts [Peanut Oil] Anaphylaxis  . Soy Allergy Anaphylaxis  . Penicillins     Yeast infection  . Sulfa Antibiotics Rash    Past Medical History:  Diagnosis Date  . Depression   . Lupus ([place])   . RA (rheumatoid arthritis) ([place])     Past Surgical History:  Procedure Laterality Date  . RENAL BIOPSY Left 5 years prior   for lupus   History reviewed. No pertinent family history.   Social History   Social History  . Marital status: Single    Spouse name: N/A  . Number of children: 1  . Years of education: trade   Occupational History  . 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 Smoking status: Former Smoker    Quit date: 06/09/2011  . Smokeless tobacco: Never Used  . Alcohol use 1.2 oz/week    2 Glasses of wine per week  . Drug use: No  . Sexual activity: Yes    Partners: Male    Birth control/ protection: Condom   Other Topics Concern  . Not on file   Social History Narrative   Originally from [place]--grew up in [place], lived in [place] to [place] in 2005   Lives with 56 yo daughter in [place].     Review of Systems     Objective:   Physical Exam NAD HEENT:  PERRL, EOMI, TMs pearly gray, throat without injection. Neck: Supple, no adenopathy Chest:  CTA CV:  RRR with normal S1 and S2, No S3, S4 or murmur.  No carotid bruits.  Carotid, Radial, DP and PT  pulses normal and equal Abd:  S, mild tender in LLQ, No HSM, no mass, + BS throughout.       Assessment & Plan:  1.  GI complaints:  Needs labs regardless for follow up of RA and SLE and long term medication use.  CBC, CMP, UA.   Likely Constipation dominant IBS.  Has already established follow up with Macie Burows, LCSW for counseling and hopefully to reduce depression/anxiety. She is to think about trying Bupropion as concerned with weight gain she has had with SSRI/SNRI. Recommend starting on low dose fiber laxative with 8 glasses of 8 oz water daily and regular physical activity.   Hold on antispasmodics for now.  2.  Depression/Anxiety:  As above.  To let me know if wants to consider Bupropion before follow up.  Otherwise, follow up in 2 months  3.  Decreased libido:  Discussed more likely related to her stress, depression and anxiety.  To work on this with Macie Burows as well.  To discuss her concerns with her 2 female partners as she has not thus far.  4.  Allergies:  Continue current medications.  5.  RA/SLE:  Send for records.  Will send lab results to Dr. Cathey Endow office.  Pt. Seen by PA there.  [facility]

## 2016-04-12 LAB — CBC WITH DIFFERENTIAL/PLATELET
BASOS: 0 %
Basophils Absolute: 0 10*3/uL (ref 0.0–0.2)
EOS (ABSOLUTE): 0 10*3/uL (ref 0.0–0.4)
EOS: 1 %
HEMATOCRIT: 38 % (ref 34.0–46.6)
HEMOGLOBIN: 12.2 g/dL (ref 11.1–15.9)
Immature Grans (Abs): 0 10*3/uL (ref 0.0–0.1)
Immature Granulocytes: 0 %
LYMPHS ABS: 1.2 10*3/uL (ref 0.7–3.1)
Lymphs: 26 %
MCH: 27.7 pg (ref 26.6–33.0)
MCHC: 32.1 g/dL (ref 31.5–35.7)
MCV: 86 fL (ref 79–97)
MONOCYTES: 5 %
Monocytes Absolute: 0.2 10*3/uL (ref 0.1–0.9)
NEUTROS ABS: 3.1 10*3/uL (ref 1.4–7.0)
Neutrophils: 68 %
Platelets: 175 10*3/uL (ref 150–379)
RBC: 4.41 x10E6/uL (ref 3.77–5.28)
RDW: 14.3 % (ref 12.3–15.4)
WBC: 4.5 10*3/uL (ref 3.4–10.8)

## 2016-04-12 LAB — COMPREHENSIVE METABOLIC PANEL
A/G RATIO: 1.4 (ref 1.2–2.2)
ALBUMIN: 4.6 g/dL (ref 3.5–5.5)
ALK PHOS: 85 IU/L (ref 39–117)
ALT: 8 IU/L (ref 0–32)
AST: 13 IU/L (ref 0–40)
BILIRUBIN TOTAL: 0.3 mg/dL (ref 0.0–1.2)
BUN / CREAT RATIO: 16 (ref 9–23)
BUN: 12 mg/dL (ref 6–24)
CO2: 24 mmol/L (ref 18–29)
CREATININE: 0.76 mg/dL (ref 0.57–1.00)
Calcium: 9.4 mg/dL (ref 8.7–10.2)
Chloride: 100 mmol/L (ref 96–106)
GFR, EST AFRICAN AMERICAN: 108 mL/min/{1.73_m2} (ref >59)
GFR, EST NON AFRICAN AMERICAN: 94 mL/min/{1.73_m2} (ref >59)
GLOBULIN, TOTAL: 3.3 g/dL (ref 1.5–4.5)
Glucose: 71 mg/dL (ref 65–99)
Potassium: 4 mmol/L (ref 3.5–5.2)
Sodium: 139 mmol/L (ref 134–144)
Total Protein: 7.9 g/dL (ref 6.0–8.5)

## 2016-04-18 ENCOUNTER — Other Ambulatory Visit (INDEPENDENT_AMBULATORY_CARE_PROVIDER_SITE_OTHER): Payer: Self-pay | Admitting: Internal Medicine

## 2016-04-18 DIAGNOSIS — Z1211 Encounter for screening for malignant neoplasm of colon: Secondary | ICD-10-CM

## 2016-04-18 LAB — POC HEMOCCULT BLD/STL (HOME/3-CARD/SCREEN)
Card #2 Fecal Occult Blod, POC: NEGATIVE
FECAL OCCULT BLD: NEGATIVE
FECAL OCCULT BLD: NEGATIVE

## 2016-04-18 NOTE — Progress Notes (Signed)
Stool cards Negative x 3

## 2016-04-25 ENCOUNTER — Ambulatory Visit (INDEPENDENT_AMBULATORY_CARE_PROVIDER_SITE_OTHER): Payer: Self-pay | Admitting: Licensed Clinical Social Worker

## 2016-04-25 DIAGNOSIS — F419 Anxiety disorder, unspecified: Secondary | ICD-10-CM

## 2016-04-25 DIAGNOSIS — F32A Depression, unspecified: Secondary | ICD-10-CM

## 2016-04-25 DIAGNOSIS — F329 Major depressive disorder, single episode, unspecified: Secondary | ICD-10-CM
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
## 2016-05-09 ENCOUNTER — Ambulatory Visit (INDEPENDENT_AMBULATORY_CARE_PROVIDER_SITE_OTHER): Payer: Self-pay | Admitting: Licensed Clinical Social Worker

## 2016-05-09 DIAGNOSIS — F330 Major depressive disorder, recurrent, mild: Secondary | ICD-10-CM

## 2016-05-13 ENCOUNTER — Other Ambulatory Visit: Payer: Self-pay

## 2016-05-13 MED ORDER — GABAPENTIN 300 MG PO CAPS: 300.0000 mg | ORAL_CAPSULE | Freq: Every day | ORAL | 11 refills | Status: DC

## 2016-05-13 NOTE — Telephone Encounter (Signed)
rx sent

## 2016-05-15 ENCOUNTER — Ambulatory Visit (INDEPENDENT_AMBULATORY_CARE_PROVIDER_SITE_OTHER): Payer: Self-pay | Admitting: Licensed Clinical Social Worker

## 2016-05-15 ENCOUNTER — Other Ambulatory Visit: Payer: Self-pay

## 2016-05-15 DIAGNOSIS — F330 Major depressive disorder, recurrent, mild: Secondary | ICD-10-CM

## 2016-05-15 MED ORDER — MONTELUKAST SODIUM 10 MG PO TABS: 10.0000 mg | ORAL_TABLET | Freq: Every day | ORAL | 11 refills | Status: DC
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
## 2016-05-15 NOTE — Telephone Encounter (Signed)
rx sent

## 2016-05-21 NOTE — Progress Notes (Signed)
   THERAPY PROGRESS NOTE  Session Time: 60 minutes  Participation Level: Active  Behavioral Response: Well GroomedAlertEuthymic  Type of Therapy: Individual Therapy  Treatment Goals addressed: Anxiety  Interventions: CBT and Motivational Interviewing   Summary: Bonnie Farmer is a 50 year old female that presents a euthymic mood with appropriate affect. Bonnie Farmer reports that she was having a good week and she does not feel depressed. Bonnie Farmer mentions that she still feels extremely tired and does not want to get out of bed but she forces herself to do so. Bonnie Farmer believes that the anxiety is coming from anticipating traveling net weekend with her partner. Bonnie Farmer began an ecomap to highlight the relationships she has with people and her surroundings. Bonnie Farmer explains her most current relationship strain is her romantic relationship. Bonnie Farmer brings up her relationship concerned with not being loves in the way that she wants. Bonnie Farmer explains that she feels that she is a failure in relationships and her career in comparison to her sister. 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   Suicidal/Homicidal: Nowithout intent/plan  Therapist Response: Social Work Intern ([place]) utilizes motivational interviewing to inquire how Bonnie Farmer has been since last week. Bonnie Farmer introduces how to use an ecomap to look at relationships that Bonnie Farmer has. Bonnie Farmer inquires more about Bonnie Farmer's ecomap and reflected on her responses. Bonnie Farmer utilizes motivational interviewing to address her overall concerns with being a failure and relationships that she has. Bonnie Farmer introduces Cognitive Behavioral Theory to Bonnie Farmer with examples. Bonnie Farmer will continue using cognitive behavioral therapy in the next session.   Plan: Return again in 1 week.  Diagnosis: Major Depressive Disorder, Mild, Recurrent, with Anxious Distress      Bonnie Farmer Or 05/21/2016

## 2016-05-22 ENCOUNTER — Other Ambulatory Visit: Payer: Self-pay | Admitting: Licensed Clinical Social Worker

## 2016-05-30 ENCOUNTER — Ambulatory Visit (INDEPENDENT_AMBULATORY_CARE_PROVIDER_SITE_OTHER): Payer: Self-pay | Admitting: Licensed Clinical Social Worker

## 2016-05-30 DIAGNOSIS — F339 Major depressive disorder, recurrent, unspecified: Secondary | ICD-10-CM

## 2016-06-04 NOTE — Progress Notes (Signed)
   THERAPY PROGRESS NOTE  Session Time: 7  Participation Level: Active  Behavioral Response: Well GroomedAlertEuthymic  Type of Therapy: Individual Therapy  Treatment Goals addressed: Anxiety and Diagnosis: Major Depressive Disorder with Anxious Distress  Interventions: CBT and Other: Mindfullness  Summary: Bonnie Farmer is a 50 y.o. female who presents with euthymic mood and appropriate affect. Bonnie Farmer shared that her weekend away went well and she wishes she could go back to [place]. Bonnie Farmer mentioned that since she has been back her negative thoughts have returned and are causing a lot of anxiety. Bonnie Farmer identified her most prominent negative core beliefs using the CBT skills worksheet. Bonnie Farmer noted that her most prominent beliefs are feeling unloved, inferior, trapped, weak, and vulnerable. Bonnie Farmer recognized that those negative core beliefs stemmed from her relationships with her parents. Bonnie Farmer attempted to identify evidence that does not support negative core beliefs but she had a difficult time with the beliefs that affected her the most. Bonnie Farmer inquired how to stop the negative beliefs when they start flooding in and her anxiety picks up. Bonnie Farmer practiced a few mindfulness activities.   Suicidal/Homicidal: Nowithout intent/plan  Therapist Response: Social Work Intern (SWI) began to [employer] negative core beliefs. SWI asked what was the source of Bonnie Farmer's negative beliefs. SWI challenged Bonnie Farmer to think of evidence that the negative thoughts were not true. SWI introduced mindfulness to Bonnie Farmer and practiced two mindfulness activities to use when she feels anxious.   Plan: Return again in 2 weeks.  Diagnosis: Axis I: Major Depressive Disorder, Mild, Recurrent, with Anxious Distress    Axis II: No diagnosis    Lorrin Goodell, Student-Social Work 06/04/2016

## 2016-06-11 ENCOUNTER — Other Ambulatory Visit: Payer: Self-pay | Admitting: Licensed Clinical Social Worker

## 2016-06-13 ENCOUNTER — Encounter: Payer: Self-pay | Admitting: Internal Medicine

## 2016-06-13 ENCOUNTER — Ambulatory Visit (INDEPENDENT_AMBULATORY_CARE_PROVIDER_SITE_OTHER): Payer: Self-pay | Admitting: Internal Medicine

## 2016-06-13 VITALS — BP 130/90 | HR 84 | Ht 65.0 in | Wt 168.0 lb

## 2016-06-13 DIAGNOSIS — F418 Other specified anxiety disorders: Secondary | ICD-10-CM

## 2016-06-13 DIAGNOSIS — N760 Acute vaginitis: Secondary | ICD-10-CM

## 2016-06-13 DIAGNOSIS — Z23 Encounter for immunization: Secondary | ICD-10-CM

## 2016-06-13 DIAGNOSIS — B9689 Other specified bacterial agents as the cause of diseases classified elsewhere: Secondary | ICD-10-CM

## 2016-06-13 MED ORDER — CLONAZEPAM 1 MG PO TABS: ORAL_TABLET | ORAL | 1 refills | Status: DC

## 2016-06-13 MED ORDER — METRONIDAZOLE 0.75 % VA GEL: 1.0000 | Freq: Two times a day (BID) | VAGINAL | 1 refills | Status: AC

## 2016-06-13 MED ORDER — DULOXETINE HCL 30 MG PO CPEP: ORAL_CAPSULE | ORAL | 11 refills | Status: DC

## 2016-06-13 NOTE — Progress Notes (Signed)
   Subjective:    Patient ID: Bonnie Farmer, female    DOB: [birth year], 50 y.o.   MRN: [id number]  HPI   1.  Anxiety:  Jumpy, nervous, wants to cry.  Last 2 weeks have been bad.  Break up with boyfriend recently and had panic attack.  Having GI issues.  Continuous sense of doom.  Noise bothers her--people talking at work seem loud and disturb her. Winding up with her negative issues a bit.     Previous meds:  Zoloft, Lamictal, Effexor:  The last was her last med.  Would do well initially with a medication, and then develop anxiety.  Does not sound like was on max dosing.   Continues with Lorrin Goodell, SW intern.  Would also like to speak with Macie Burows.  Feels she could relate better with someone who is a bit older.  2.  Vaginal discharge:  Similar to the many times she has had BV.  Did have Metrogel at home to use intermittently, but ran out.  Stable partner without symptoms.  Current Meds  Medication Sig  . Adalimumab (HUMIRA PEN) 40 MG/0.8ML PNKT Inject 40 mg into the skin once a week.  . Biotin 5 MG CAPS Take 1 capsule by mouth daily.  . fluticasone (FLONASE) 50 MCG/ACT nasal spray Place 2 sprays into both nostrils daily.  Marland Kitchen gabapentin (NEURONTIN) 300 MG capsule Take 1 capsule (300 mg total) by mouth at bedtime.  . Methotrexate, PF, 10 MG/0.2ML SOAJ Inject 1 mL into the skin once a week.  . montelukast (SINGULAIR) 10 MG tablet Take 1 tablet (10 mg total) by mouth at bedtime.  . traZODone (DESYREL) 100 MG tablet Take 100 mg by mouth at bedtime.   Allergies  Allergen Reactions  . Peanuts [Peanut Oil] Anaphylaxis  . Soy Allergy Anaphylaxis  . Penicillins     Yeast infection  . Sulfa Antibiotics Rash     Review of Systems     Objective:   Physical Exam  Very anxious appearing Lungs:  CTA CV:  RRR without murmur or rub        Assessment & Plan:  Anxiety with depression:  Clonazepam 1 mg, 1/2 to 1 tab twice daily as needed for anxiety.  Discussed at length that this  will be temporary--max of 2-3 months as she transitions onto Duloxetine/Cymbalta. Through [facility]:  Cymbalta 30 mg for 7 days, then increase to 60 mg dailyl Follow up in 1 week. Did not want note for work.  States could not afford to take the time off.    Bacterial Vaginosis:  Metrogel twice daily for 5 days.  Will discuss suppressive treatment at follow up.  Patient needed to leave to get ready for work.

## 2016-06-16 ENCOUNTER — Encounter: Payer: Self-pay | Admitting: Internal Medicine

## 2016-06-19 ENCOUNTER — Ambulatory Visit (INDEPENDENT_AMBULATORY_CARE_PROVIDER_SITE_OTHER): Payer: Self-pay | Admitting: Licensed Clinical Social Worker

## 2016-06-19 ENCOUNTER — Telehealth: Payer: Self-pay | Admitting: Internal Medicine

## 2016-06-19 DIAGNOSIS — F339 Major depressive disorder, recurrent, unspecified: Secondary | ICD-10-CM

## 2016-06-19 NOTE — Progress Notes (Signed)
   THERAPY PROGRESS NOTE  Session Time: 32min  Participation Level: Active  Behavioral Response: CasualAlertAnxious and Depressed  Type of Therapy: Individual Therapy  Treatment Goals addressed: Coping  Interventions: CBT and Supportive  Summary: Bonnie Farmer is a 50 y.o. female who presents with a mood fluctuating between depressed and anxious, with appropriate affect. She shared that she has not been doing well over the past three weeks, ever since a difficult break-up. She expressed the pain and hurt that she's going through. She stated that she keeps having the thought, "I'm not enough." Bonnie Farmer gave several examples of situations in her life which she felt supported the thought of "I'm not enough." After examining the evidence, she was able to determine that the situations did not actually mean that she wasn't enough and she concluded that the thought was irrational. Bonnie Farmer shared about her difficult relationship with her overly-critical mother, including fears about her upcoming visit. She decided that the best way to handle her mother's criticism is to reframe it as coming from a place of love and concern, and to not engage in arguments. Bonnie Farmer expressed concerns about her daughter also having anxiety and feeling that she is to blame. She shared about the ways that she and her daughter support each other.  Suicidal/Homicidal: Nowithout intent/plan  Therapist Response: LCSW utilized supportive counseling techniques throughout the session in order to validate emotions and encourage open expression of emotion. LCSW assisted in identifying an automatic thought that has been causing distress recently. LCSW helped examine the evidence that supports the thought and the evidence that does not support the thought. LCSW reflected on how Bonnie Farmer learned the message "I'm not enough" early in her life, but that it is possible to change the message. LCSW provided affirmations to Bonnie Farmer for setting healthy  boundaries with her boyfriend, even though it resulted in a break up.  Plan: Return again in 1 weeks.  Diagnosis: Axis I: See current hospital problem list    Axis II: No diagnosis    Bonnie Clines, LCSW 06/19/2016

## 2016-06-19 NOTE — Telephone Encounter (Signed)
Patient came in and states she is now taking Cymbalta but now she prefers to try Wellbutrin instead because she is gaining weight and loss of sex drive and thinks that will send her back into a depression.

## 2016-06-20 NOTE — Telephone Encounter (Signed)
LVM

## 2016-06-20 NOTE — Telephone Encounter (Signed)
Will need to discuss with her before switching-please ask her to continue the Cymbalta until I see her Monday.

## 2016-06-23 ENCOUNTER — Encounter: Payer: Self-pay | Admitting: Internal Medicine

## 2016-06-23 ENCOUNTER — Ambulatory Visit (INDEPENDENT_AMBULATORY_CARE_PROVIDER_SITE_OTHER): Payer: Self-pay | Admitting: Internal Medicine

## 2016-06-23 VITALS — BP 118/78 | HR 88 | Resp 18 | Ht 65.0 in | Wt 169.5 lb

## 2016-06-23 DIAGNOSIS — F418 Other specified anxiety disorders: Secondary | ICD-10-CM

## 2016-06-23 DIAGNOSIS — F329 Major depressive disorder, single episode, unspecified: Secondary | ICD-10-CM

## 2016-06-23 DIAGNOSIS — F32A Depression, unspecified: Secondary | ICD-10-CM

## 2016-06-23 DIAGNOSIS — F419 Anxiety disorder, unspecified: Principal | ICD-10-CM

## 2016-06-23 DIAGNOSIS — B360 Pityriasis versicolor: Secondary | ICD-10-CM

## 2016-06-23 DIAGNOSIS — L858 Other specified epidermal thickening: Secondary | ICD-10-CM

## 2016-06-23 MED ORDER — BUPROPION HCL ER (SR) 150 MG PO TB12: 150.0000 mg | ORAL_TABLET | Freq: Two times a day (BID) | ORAL | 2 refills | Status: DC

## 2016-06-23 NOTE — Patient Instructions (Signed)
Nizoral shampoo twice daily for 10 minutes for Tinea versicolor (2 weeks) Or Terbinafine cream (Lamisil)  And apply twice daily for 2 weeks.  For Keratosis pilaris/eczema:  Eucerin cream for eczema relief twice daily--really important after shower.

## 2016-06-23 NOTE — Progress Notes (Signed)
   Subjective:    Patient ID: Bonnie Farmer, female    DOB: 10-31-1965, 50 y.o.   MRN: [id number]  HPI   Depression/Anxiety:  Feeling better.  Taking the Clonazepam only as needed.  Did not make her woozy, just slowed her down.  Is only really needing when at work.   Never went to [facility] to start process of Cymbalta.   Would like to try Bupropion though discussed concern for her recent anxiety.  She is very concerned she will gain weight and also lose her sex drive with SSRI containing med.  She feels this will increase her depression if it occurs.  2.  Bumps on arms for past few months:  Scaly bumps.  Used Triamcinolone--helped, but would come back. No with hypopigmented areas near where she had the bumps.  Does use lotion regularly, but not all over.  Current Meds  Medication Sig  . Biotin 5 MG CAPS Take 1 capsule by mouth daily.  . clonazePAM (KLONOPIN) 1 MG tablet 1/2 to 1 tab by mouth twice daily as needed  . fluticasone (FLONASE) 50 MCG/ACT nasal spray Place 2 sprays into both nostrils daily.  Marland Kitchen gabapentin (NEURONTIN) 300 MG capsule Take 1 capsule (300 mg total) by mouth at bedtime.  . montelukast (SINGULAIR) 10 MG tablet Take 1 tablet (10 mg total) by mouth at bedtime.  . traZODone (DESYREL) 100 MG tablet Take 100 mg by mouth at bedtime.    Allergies  Allergen Reactions  . Peanuts [Peanut Oil] Anaphylaxis  . Soy Allergy Anaphylaxis  . Penicillins     Yeast infection  . Sulfa Antibiotics Rash    Review of Systems     Objective:   Physical Exam  NAD Much calmer today. Lungs: CTA CV:  RRR without murmur or rub. Skin:  Skin colored tiny dry papular lesions in large patches on outer arms, thighs, lateroposterior chest wall. Also with hypopigmented elliptical and round lesions with fine scaling of upper arms, shoulders, lateral trunk        Assessment & Plan:  1.  Depression with anxiety:  Will try Bupropion SR 150 mg daily for 3-7 days, then increase to twice daily as  tolerates.  May use Clonazepam to calm anxiety if occurs.  Reiterated the Clonazepam is temporary.  2.  Keratosis pilaris:  Dove soap,  Eucerin cream with eczema relief after showers and one other time of day.  3.  Tinea Versicolor:  Try Nizoral shampoo OTC for 10 minutes twice daily to affected skin for 10 minutes then wash off. Or may try Terbinafine cream twice daily for 2 weeks.  To cover affected areas with this followed by hydrating cream.

## 2016-06-24 NOTE — Telephone Encounter (Signed)
Patient was seen at office on 06/23/16

## 2016-06-26 ENCOUNTER — Other Ambulatory Visit: Payer: Self-pay | Admitting: Licensed Clinical Social Worker

## 2016-07-03 ENCOUNTER — Ambulatory Visit (INDEPENDENT_AMBULATORY_CARE_PROVIDER_SITE_OTHER): Payer: Self-pay | Admitting: Licensed Clinical Social Worker

## 2016-07-03 DIAGNOSIS — F339 Major depressive disorder, recurrent, unspecified: Secondary | ICD-10-CM

## 2016-07-03 NOTE — Progress Notes (Signed)
   THERAPY PROGRESS NOTE  Session Time: 87min  Participation Level: Active  Behavioral Response: Neat and Well GroomedAlertDepressed  Type of Therapy: Individual Therapy  Treatment Goals addressed: Coping  Interventions: CBT and Supportive  Summary: Bonnie Farmer is a 50 y.o. female who presents with a depressed mood and appropriate affect. She reported that she started the Wellbutrin a little over a week ago and that it has not increased her anxiety. She reported that she feels more "blah" or numb than previously, but doesn't know if it is due to the new medicine or the bad weather. Bonnie Farmer shared that she has some anxiety about her mother coming to visit for Thanksgiving. She processed about how she can set healthy boundaries with her mother and reframe her mother's criticism as love/concern. Bonnie Farmer shared that she has been healing from a recent breakup and that she has started having feelings for a new man. She expressed the excitement and fear she has to start something new. She shared that she has been thinking about the past session and how to catch her negative thoughts, particularly "I'm not enough." She shared that she recently realized that the thought wasn't true, that she is actually enough. She became tearful as she shared about the powerful feeling of realizing that she is enough, just as she is. 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 She reported that her job causes her a lot of stress and anxiety.   Suicidal/Homicidal: Nowithout intent/plan  Therapist Response: LCSW utilized supportive counseling techniques throughout the session in order to validate emotions and encourage open expression of emotion. LCSW checked in regarding medication compliance and health issues. LCSW and Bonnie Farmer discussed how she was using CBT techniques to recognize and re-frame irrational thoughts.  Plan: Return again in 3  weeks.  Diagnosis: Axis I: See current hospital problem list    Axis II: No diagnosis    Metta Clines, LCSW 07/03/2016

## 2016-07-07 ENCOUNTER — Ambulatory Visit: Payer: Self-pay | Admitting: Internal Medicine

## 2016-07-10 ENCOUNTER — Telehealth: Payer: Self-pay | Admitting: Internal Medicine

## 2016-07-10 NOTE — Telephone Encounter (Signed)
Patient called to leave message for Dr. Amil Amen stating she was not able to stay on the buPROPion [facility] SR) 150 MG 12 hr tablet [id number] 2 times a day and only did it for 3 days because it was making her nauseous, had stomach discomfort and heart pain.  Patient also apologizes for her [facility] visit on 07/07/16

## 2016-07-15 NOTE — Telephone Encounter (Signed)
Will need to make an appt. To see where to go from here.

## 2016-07-21 ENCOUNTER — Telehealth: Payer: Self-pay | Admitting: Internal Medicine

## 2016-07-21 NOTE — Telephone Encounter (Signed)
Patient's[facility] card will expire in December.  Patient will have insurance via her employer and wants to know if Dr. Amil Amen can recommend another physician for her.

## 2016-07-22 NOTE — Telephone Encounter (Signed)
Kelton Pillar, [place] at [facility]. Jacalyn Lefevre at [facility] that is on [employer] there are generally all good.

## 2016-07-24 ENCOUNTER — Ambulatory Visit (INDEPENDENT_AMBULATORY_CARE_PROVIDER_SITE_OTHER): Payer: Self-pay | Admitting: Licensed Clinical Social Worker

## 2016-07-24 DIAGNOSIS — F339 Major depressive disorder, recurrent, unspecified: Secondary | ICD-10-CM

## 2016-07-24 NOTE — Progress Notes (Signed)
   THERAPY PROGRESS NOTE  Session Time: 22min  Participation Level: Active  Behavioral Response: Casual and Well GroomedAlertEuthymic  Type of Therapy: Individual Therapy  Treatment Goals addressed: Coping  Interventions: CBT and Supportive  Summary: Bonnie Farmer is a 50 y.o. female who presents with a euthymic mood and appropriate affect. She reported that she had recently stopped taking the antidepressant Wellbutrin because of side effects including constipation, nausea, and chest pain. She reported that she was not feeling any symptoms since stopping the medication. She shared that she was interested in being on an antidepressant but that she has tried multiple kinds over the years and all of them have side effects. She agreed that she should make an appointment with a psychiatrist. Bonnie Farmer shared about her mother's recent visit to her house for Thanksgiving; she expressed frustration that her mother had continued to be critical and demanding. She shared about having a frank conversation with her mother about her painful behaviors. Bonnie Farmer shared that after the visit, her mother called her and admitted for the first time that she is overly critical towards Bonnie Farmer and that she does it out of love and concern. She became tearful as she shared about feeling validated by her mother for the first time. Bonnie Farmer also shared about her new relationship and the ways in which it is helping her to heal. She shared that she is using CBT techniques to examine irrational thoughts, especially negative thoughts about herself, and that it is effective.    Suicidal/Homicidal: Nowithout intent/plan  Therapist Response: LCSW utilized supportive counseling techniques throughout the session in order to validate emotions and encourage open expression of emotion. LCSW checked in regarding medication compliance and health issues. LCSW assisted in identifying an automatic thought that has been causing distress recently. LCSW  helped examine the evidence that supports the thought and the evidence that does not support the thought. LCSW guided in reframing the irrational thought into a more positive, rational thought. LCSW provided affirmations to Bonnie Farmer for being able to identify and examine negative thoughts.  Plan: Return again in 2 weeks.  Diagnosis: Axis I: See current hospital problem list    Axis II: No diagnosis    Metta Clines, LCSW 07/24/2016

## 2016-07-30 NOTE — Telephone Encounter (Signed)
Pt. Scheduled to come in and discuss concerns 08/01/16 @ 10:30 AM

## 2016-08-01 ENCOUNTER — Encounter: Payer: Self-pay | Admitting: Internal Medicine

## 2016-08-01 ENCOUNTER — Ambulatory Visit (INDEPENDENT_AMBULATORY_CARE_PROVIDER_SITE_OTHER): Payer: Self-pay | Admitting: Internal Medicine

## 2016-08-01 VITALS — BP 124/82 | HR 68 | Resp 12 | Ht 65.0 in | Wt 166.0 lb

## 2016-08-01 DIAGNOSIS — J3089 Other allergic rhinitis: Secondary | ICD-10-CM

## 2016-08-01 DIAGNOSIS — F329 Major depressive disorder, single episode, unspecified: Secondary | ICD-10-CM

## 2016-08-01 DIAGNOSIS — F419 Anxiety disorder, unspecified: Secondary | ICD-10-CM

## 2016-08-01 DIAGNOSIS — F32A Depression, unspecified: Secondary | ICD-10-CM

## 2016-08-01 MED ORDER — OLOPATADINE HCL 0.2 % OP SOLN: OPHTHALMIC | 11 refills | Status: DC

## 2016-08-01 NOTE — Patient Instructions (Signed)
Try one antifhistamine and will have to switch out to another when it stops working

## 2016-08-01 NOTE — Progress Notes (Signed)
   Subjective:    Patient ID: Bonnie Farmer, female    DOB: 1965-09-14, 50 y.o.   MRN: [id number]  HPI   Last appt. 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  Anxiety and Depression:  Did not like the way she felt with Bupropion and stopped taking.  Feels she is doing better and does not want to take anything at this point.  Finishing up with Bonnie Burows, LCSW with counseling, feels their work together has been quite helpful.  2.  Allergies:  Itchy watery eyes.  Seems long acting antihistamines stop working for her after a time.  Continues on Montelukast and Fluticasone nasal spray.  Current Meds  Medication Sig  . Adalimumab (HUMIRA PEN) 40 MG/0.8ML PNKT Inject 40 mg into the skin once a week.  . Biotin 5 MG CAPS Take 1 capsule by mouth daily.  . clonazePAM (KLONOPIN) 1 MG tablet 1/2 to 1 tab by mouth twice daily as needed  . fluticasone (FLONASE) 50 MCG/ACT nasal spray Place 2 sprays into both nostrils daily.  Marland Kitchen gabapentin (NEURONTIN) 300 MG capsule Take 1 capsule (300 mg total) by mouth at bedtime.  . montelukast (SINGULAIR) 10 MG tablet Take 1 tablet (10 mg total) by mouth at bedtime.  . [DISCONTINUED] traZODone (DESYREL) 100 MG tablet Take 100 mg by mouth at bedtime.     Review of Systems     Objective:   Physical Exam NAD HEENT:  Eyes red and watery, PERRL, EOMI, TMs pearly gray, nasal mucosa minimally swollen with clear discharge.Throat without injection Neck:  Supple, No adenopathy Chest:  CTA CV:  RRR with our murmur or rub       Assessment & Plan:  1.  Depression and Anxiety:  Currently stable and not wanting to try a different medication.  Finishing up with Bonnie Burows, LCSW and will establish with another provider.  To call if any emergent needs in meantime.  2.  Allergies:  Eyes most significantly involved:  Pataday eye drops and to try otc 24 hour antihistamine.  Rotate which one she uses based on how effective for her symptoms

## 2016-08-05 ENCOUNTER — Ambulatory Visit (INDEPENDENT_AMBULATORY_CARE_PROVIDER_SITE_OTHER): Payer: Self-pay | Admitting: Licensed Clinical Social Worker

## 2016-08-05 DIAGNOSIS — F339 Major depressive disorder, recurrent, unspecified: Secondary | ICD-10-CM

## 2016-08-06 NOTE — Progress Notes (Signed)
   THERAPY PROGRESS NOTE  Session Time: 55min  Participation Level: Active  Behavioral Response: Neat and Well GroomedAlertEuthymic  Type of Therapy: Individual Therapy  Treatment Goals addressed: Coping  Interventions: CBT and Supportive  Summary: Bonnie Farmer is a 50 y.o. female who presents with a euthymic mood and appropriate affect. She reported that she had gone back on Wellbutrin after her doctor's appointment, but at a very low does, and that she was feeling good. She shared that she is feeling really good overall, even when she was off the antidepressant totally. 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 Bonnie Farmer shared that her romantic relationship continues to be really positive and fulfills her emotional needs. She reported that her fears and worries have started to pop up, but when she has a negative thought, she has been using CBT techniques to examine the thought and reframe it. She stated that this has been very effective in helping her to realize how many of her thoughts are irrational. She shared that she also has guilty feelings coming up, especially about things that she doesn't need to be guilty about. She committed to using the same CBT techniques when guilt arises.   Suicidal/Homicidal: Nowithout intent/plan  Therapist Response: LCSW utilized supportive counseling techniques throughout the session in order to validate emotions and encourage open expression of emotion. LCSW and Bonnie Farmer reviewed the CBT techniques that she has been using. LCSW provided affirmations to Bonnie Farmer for her hard work in applying the techniques outside of session. LCSW emphasized that she can use the same techniques for guilt.  Plan: Return again in 2 weeks.  Diagnosis: Axis I: See current hospital problem list    Axis II: No diagnosis    Metta Clines, LCSW 08/06/2016

## 2016-08-20 ENCOUNTER — Other Ambulatory Visit: Payer: Self-pay

## 2016-08-20 NOTE — Telephone Encounter (Signed)
To Dr. Mulberry for approval 

## 2016-08-22 ENCOUNTER — Other Ambulatory Visit: Payer: Self-pay | Admitting: Licensed Clinical Social Worker

## 2016-08-26 ENCOUNTER — Other Ambulatory Visit: Payer: Self-pay | Admitting: Licensed Clinical Social Worker

## 2016-08-30 MED ORDER — TRAZODONE HCL 100 MG PO TABS: 100.0000 mg | ORAL_TABLET | Freq: Every day | ORAL | 1 refills | Status: DC

## 2016-09-02 ENCOUNTER — Ambulatory Visit: Payer: Self-pay | Admitting: Licensed Clinical Social Worker

## 2016-09-02 DIAGNOSIS — F339 Major depressive disorder, recurrent, unspecified: Secondary | ICD-10-CM

## 2016-09-04 NOTE — Progress Notes (Signed)
   THERAPY PROGRESS NOTE  Session Time: 53min  Participation Level: Active  Behavioral Response: Casual and Well GroomedAlertEuthymic  Type of Therapy: Individual Therapy  Treatment Goals addressed: Coping  Interventions: Strength-based and Supportive  Summary: Bonnie Farmer is a 51 y.o. female who presents with a positive mood and appropriate affect. She shared about her New Years trip to [place] with her boyfriend and daughter, which was difficult because of her daughter's negative moods. Bonnie Farmer shared that when she was finally able to talk to her daughter, her daughter stated that she was having suicidal thoughts. Bonnie Farmer expressed the pain and dismay that she felt when hearing her daughter say this. She reported that her daughter is now set up with therapy. Bonnie Farmer reported that she has a new therapist, too, now that she has private insurance, and today will be the last session with LCSW. She shared the ways that counseling has helped her "rediscover" herself over the past few months. She shared about her newfound happiness since learning CBT techniques and being able to shut down her negative thinking patterns. Bonnie Farmer expressed hope for her future now that her daughter is getting help, she herself is in a healthy new relationship, and she is working on her goal of becoming an Therapist, sports.  Suicidal/Homicidal: Nowithout intent/plan  Therapist Response: LCSW utilized supportive counseling techniques throughout the session in order to validate emotions and encourage open expression of emotion. LCSW reflected on the incredible progress Bonnie Farmer has made in being able to recognize and reframe her negative thoughts, as well as value herself more. LCSW provided affirmations to Bonnie Farmer for all of her hard work and her commitment to continuing in counseling.  Plan: Return again in 0 weeks.  Diagnosis: Axis I: See current hospital problem list    Axis II: No diagnosis    Metta Clines, LCSW 09/04/2016

## 2016-09-06 ENCOUNTER — Encounter: Payer: Self-pay | Admitting: Internal Medicine

## 2016-09-20 ENCOUNTER — Encounter: Payer: Self-pay | Admitting: Internal Medicine

## 2016-11-17 ENCOUNTER — Ambulatory Visit (INDEPENDENT_AMBULATORY_CARE_PROVIDER_SITE_OTHER): Payer: Managed Care, Other (non HMO) | Admitting: Nurse Practitioner

## 2016-11-17 ENCOUNTER — Other Ambulatory Visit (INDEPENDENT_AMBULATORY_CARE_PROVIDER_SITE_OTHER): Payer: Managed Care, Other (non HMO)

## 2016-11-17 ENCOUNTER — Encounter: Payer: Self-pay | Admitting: Nurse Practitioner

## 2016-11-17 VITALS — BP 112/86 | HR 80 | Temp 98.0°F | Ht 65.0 in | Wt 167.0 lb

## 2016-11-17 DIAGNOSIS — K5900 Constipation, unspecified: Secondary | ICD-10-CM | POA: Insufficient documentation

## 2016-11-17 DIAGNOSIS — R413 Other amnesia: Secondary | ICD-10-CM

## 2016-11-17 DIAGNOSIS — R3915 Urgency of urination: Secondary | ICD-10-CM

## 2016-11-17 DIAGNOSIS — F339 Major depressive disorder, recurrent, unspecified: Secondary | ICD-10-CM

## 2016-11-17 DIAGNOSIS — Z7251 High risk heterosexual behavior: Secondary | ICD-10-CM

## 2016-11-17 DIAGNOSIS — L308 Other specified dermatitis: Secondary | ICD-10-CM | POA: Diagnosis not present

## 2016-11-17 LAB — CBC WITH DIFFERENTIAL/PLATELET
Basophils Absolute: 0 10*3/uL (ref 0.0–0.1)
Basophils Relative: 0.6 % (ref 0.0–3.0)
EOS ABS: 0 10*3/uL (ref 0.0–0.7)
Eosinophils Relative: 0.9 % (ref 0.0–5.0)
HEMATOCRIT: 37.5 % (ref 36.0–46.0)
Hemoglobin: 12 g/dL (ref 12.0–15.0)
LYMPHS PCT: 22.7 % (ref 12.0–46.0)
Lymphs Abs: 1.2 10*3/uL (ref 0.7–4.0)
MCHC: 31.9 g/dL (ref 30.0–36.0)
MCV: 85.3 fl (ref 78.0–100.0)
Monocytes Absolute: 0.3 10*3/uL (ref 0.1–1.0)
Monocytes Relative: 5.6 % (ref 3.0–12.0)
NEUTROS ABS: 3.7 10*3/uL (ref 1.4–7.7)
Neutrophils Relative %: 70.2 % (ref 43.0–77.0)
PLATELETS: 175 10*3/uL (ref 150.0–400.0)
RBC: 4.39 Mil/uL (ref 3.87–5.11)
RDW: 14.2 % (ref 11.5–15.5)
WBC: 5.2 10*3/uL (ref 4.0–10.5)

## 2016-11-17 LAB — COMPREHENSIVE METABOLIC PANEL
ALBUMIN: 4.3 g/dL (ref 3.5–5.2)
ALK PHOS: 65 U/L (ref 39–117)
ALT: 8 U/L (ref 0–35)
AST: 11 U/L (ref 0–37)
BUN: 12 mg/dL (ref 6–23)
CALCIUM: 9.3 mg/dL (ref 8.4–10.5)
CHLORIDE: 102 meq/L (ref 96–112)
CO2: 27 mEq/L (ref 19–32)
Creatinine, Ser: 0.8 mg/dL (ref 0.40–1.20)
GFR: 98.36 mL/min (ref >60.00)
Glucose, Bld: 92 mg/dL (ref 70–99)
POTASSIUM: 4 meq/L (ref 3.5–5.1)
Sodium: 135 mEq/L (ref 135–145)
Total Bilirubin: 0.5 mg/dL (ref 0.2–1.2)
Total Protein: 7.9 g/dL (ref 6.0–8.3)

## 2016-11-17 LAB — URINALYSIS, ROUTINE W REFLEX MICROSCOPIC
Bilirubin Urine: NEGATIVE
KETONES UR: NEGATIVE
NITRITE: NEGATIVE
PH: 6 (ref 5.0–8.0)
SPECIFIC GRAVITY, URINE: 1.02 (ref 1.000–1.030)
TOTAL PROTEIN, URINE-UPE24: NEGATIVE
URINE GLUCOSE: NEGATIVE
UROBILINOGEN UA: 0.2 (ref 0.0–1.0)

## 2016-11-17 LAB — VITAMIN B12: Vitamin B-12: 741 pg/mL (ref 211–911)

## 2016-11-17 LAB — FOLATE: FOLATE: 15.9 ng/mL (ref >5.9)

## 2016-11-17 LAB — TSH: TSH: 2.01 u[IU]/mL (ref 0.35–4.50)

## 2016-11-17 MED ORDER — HYDROCORTISONE 0.5 % EX CREA: 1.0000 "application " | TOPICAL_CREAM | Freq: Two times a day (BID) | CUTANEOUS | 0 refills | Status: DC

## 2016-11-17 NOTE — Patient Instructions (Signed)
Go to basement for blood draw. You will be called with results.   Constipation, Adult Constipation is when a person has fewer bowel movements in a week than normal, has difficulty having a bowel movement, or has stools that are dry, hard, or larger than normal. Constipation may be caused by an underlying condition. It may become worse with age if a person takes certain medicines and does not take in enough fluids. Follow these instructions at home: Eating and drinking    Eat foods that have a lot of fiber, such as fresh fruits and vegetables, whole grains, and beans.  Limit foods that are high in fat, low in fiber, or overly processed, such as french fries, hamburgers, cookies, candies, and soda.  Drink enough fluid to keep your urine clear or pale yellow. General instructions   Exercise regularly or as told by your health care provider.  Go to the restroom when you have the urge to go. Do not hold it in.  Take over-the-counter and prescription medicines only as told by your health care provider. These include any fiber supplements.  Practice pelvic floor retraining exercises, such as deep breathing while relaxing the lower abdomen and pelvic floor relaxation during bowel movements.  Watch your condition for any changes.  Keep all follow-up visits as told by your health care provider. This is important. Contact a health care provider if:  You have pain that gets worse.  You have a fever.  You do not have a bowel movement after 4 days.  You vomit.  You are not hungry.  You lose weight.  You are bleeding from the anus.  You have thin, pencil-like stools. Get help right away if:  You have a fever and your symptoms suddenly get worse.  You leak stool or have blood in your stool.  Your abdomen is bloated.  You have severe pain in your abdomen.  You feel dizzy or you faint. This information is not intended to replace advice given to you by your health care provider.  Make sure you discuss any questions you have with your health care provider. Document Released: 05/09/2004 Document Revised: 02/29/2016 Document Reviewed: 01/30/2016 Elsevier Interactive Patient Education  2017 [employer].

## 2016-11-17 NOTE — Progress Notes (Signed)
Pre visit review using our clinic review tool, if applicable. No additional management support is needed unless otherwise documented below in the visit note. 

## 2016-11-17 NOTE — Progress Notes (Signed)
Subjective:    Patient ID: Bonnie Farmer, female    DOB: [birth year], 51 y.o.   MRN: [id number]  Patient presents today to establish care (new patient)  HPI  Mood swings and Depression: Waxing and waning for several years. No current medications. Unable to tolerate side effects.(lamictal, lexapro, wellbutrin). Current patient of psychiatry.  Rash: Onset 24months ago, no itching, nor redness. Some improvement with eczema cream.  Constipation: Chronic,improvement with se of bulking agent, ongoing for several  Years. Has BM once or twice a week. FH of IBS(mother). No colonoscopy done. No exercise. No high fiber diet  Memory difficulty: Difficulty remembering words.  Immunizations: (TDAP, Hep C screen, Pneumovax, Influenza, zoster)  Health Maintenance  Topic Date Due  . HIV Screening  08/21/1983  . Pap Smear  08/20/1989  . Tetanus Vaccine  06/13/2026  . Flu Shot  Completed   Diet: Weight:  Wt Readings from Last 3 Encounters:  11/17/16 167 lb (75.8 kg)  08/01/16 166 lb (75.3 kg)  06/23/16 169 lb 8 oz (76.9 kg)   Fall Risk: Fall Risk  11/17/2016  Falls in the past year? No   Depression/Suicide: Depression screen [facility] 2/9 11/17/2016 04/11/2016  Decreased Interest 3 2  Down, Depressed, Hopeless 3 2  PHQ - 2 Score 6 4  Altered sleeping 0 1  Tired, decreased energy 1 3  Change in appetite 0 1  Feeling bad or failure about yourself  0 2  Trouble concentrating 0 1  Moving slowly or fidgety/restless 0 0  Suicidal thoughts 0 0  PHQ-9 Score 7 12  Difficult doing work/chores - Very difficult   Medications and allergies reviewed with patient and updated if appropriate.  Patient Active Problem List   Diagnosis Date Noted  . Urinary urgency 11/17/2016  . Memory difficulty 11/17/2016  . Constipation 11/17/2016  . Tinea versicolor 06/23/2016  . Keratosis pilaris 06/23/2016  . SLE (systemic lupus erythematosus) ([place]) 04/11/2016  . Rheumatoid arthritis ([place]) 04/11/2016    . Environmental and seasonal allergies 04/11/2016  . Decreased libido 04/11/2016  . Depression 04/11/2016  . Anxiety 04/11/2016  . Abdominal pain 04/11/2016    Current Outpatient Prescriptions on File Prior to Visit  Medication Sig Dispense Refill  . Adalimumab (HUMIRA PEN) 40 MG/0.8ML PNKT Inject 40 mg into the skin once a week.    . Biotin 5 MG CAPS Take 1 capsule by mouth daily.    . clonazePAM (KLONOPIN) 1 MG tablet 1/2 to 1 tab by mouth twice daily as needed 30 tablet 1  . fluticasone (FLONASE) 50 MCG/ACT nasal spray Place 2 sprays into both nostrils daily.    . folic acid (FOLVITE) 387 MCG tablet Take 800 mcg by mouth daily.    Marland Kitchen gabapentin (NEURONTIN) 300 MG capsule Take 1 capsule (300 mg total) by mouth at bedtime. 30 capsule 11  . Methotrexate, PF, 10 MG/0.2ML SOAJ Inject 1 mL into the skin once a week.    . montelukast (SINGULAIR) 10 MG tablet Take 1 tablet (10 mg total) by mouth at bedtime. 30 tablet 11  . Olopatadine HCl (PATADAY) 0.2 % SOLN 1 drop both eyes daily for allergies 2.5 mL 11  . traZODone (DESYREL) 100 MG tablet Take 1 tablet (100 mg total) by mouth at bedtime. 30 tablet 1  . buPROPion (WELLBUTRIN SR) 150 MG 12 hr tablet Take 1 tablet (150 mg total) by mouth 2 (two) times daily. (Patient not taking: Reported on 08/01/2016) 60 tablet 2   No current facility-administered medications  on file prior to visit.     Past Medical History:  Diagnosis Date  . Allergy   . Depression   . Lupus   . Positive TB test   . RA (rheumatoid arthritis) ([place])     Past Surgical History:  Procedure Laterality Date  . RENAL BIOPSY Left 5 years prior   for lupus  . TONSILLECTOMY      Social History   Social History  . Marital status: Single    Spouse name: N/A  . Number of children: 1  . Years of education: trade   Occupational History  . 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 Smoking status: Former Smoker    Quit date: 06/09/2011  . Smokeless  tobacco: Never Used  . Alcohol use 1.2 oz/week    2 Glasses of wine per week     Comment: social  . Drug use: No  . Sexual activity: Yes    Partners: Male    Birth control/ protection: Condom   Other Topics Concern  . None   Social History Narrative   Originally from [place]--grew up in [place], lived in [place] to [place] in 2005   Lives with 67 yo daughter in [place].    Family History  Problem Relation Age of Onset  . Arthritis Mother   . Heart disease Mother   . Mental illness Mother   . Arthritis Father   . Hyperlipidemia Father   . Drug abuse Brother   . Mental illness Brother        Review of Systems  Constitutional: Negative for chills, fever, malaise/fatigue and weight loss.  HENT: Negative for congestion and sore throat.   Eyes:       Negative for visual changes  Respiratory: Negative for cough and shortness of breath.   Cardiovascular: Negative for chest pain, palpitations and leg swelling.  Gastrointestinal: Positive for abdominal pain and constipation. Negative for blood in stool, diarrhea, heartburn, nausea and vomiting.  Genitourinary: Negative for dysuria, frequency and urgency.  Musculoskeletal: Negative for falls, joint pain and myalgias.  Skin: Positive for rash.  Neurological: Negative for dizziness, sensory change and headaches.  Endo/Heme/Allergies: Does not bruise/bleed easily.  Psychiatric/Behavioral: Positive for depression and memory loss. Negative for substance abuse and suicidal ideas. The patient is nervous/anxious.     Objective:   Vitals:   11/17/16 1129  BP: 112/86  Pulse: 80  Temp: 98 F (36.7 C)    Body mass index is 27.79 kg/m.   Physical Examination:  Physical Exam  Constitutional: She is oriented to person, place, and time and well-developed, well-nourished, and in no distress. No distress.  HENT:  Right Ear: Tympanic membrane, external ear and ear canal normal.  Left Ear: Tympanic membrane, external ear  and ear canal normal.  Nose: Mucosal edema, rhinorrhea and septal deviation present. Right sinus exhibits no maxillary sinus tenderness and no frontal sinus tenderness. Left sinus exhibits no maxillary sinus tenderness and no frontal sinus tenderness.  Mouth/Throat: Uvula is midline. Posterior oropharyngeal erythema present. No oropharyngeal exudate.  Eyes: Conjunctivae and EOM are normal. Pupils are equal, round, and reactive to light. No scleral icterus.  Neck: Normal range of motion. Neck supple. No thyromegaly present.  Cardiovascular: Normal rate, normal heart sounds and intact distal pulses.   Pulmonary/Chest: Effort normal and breath sounds normal. She exhibits no tenderness.  Abdominal: Soft. Bowel sounds are normal. She exhibits no distension. There is no tenderness.  Musculoskeletal: Normal range of motion. She exhibits no edema or tenderness.  Lymphadenopathy:    She has no cervical adenopathy.  Neurological: She is alert and oriented to person, place, and time. Gait normal.  Skin: Skin is warm, dry and intact. Rash noted. Rash is maculopapular.     Psychiatric: Affect and judgment normal.  Vitals reviewed.  ASSESSMENT and PLAN:  Amillia was seen today for establish care.  Diagnoses and all orders for this visit:  Constipation, unspecified constipation type -     CBC w/Diff; Future -     TSH; Future -     RPR; Future -     Comprehensive metabolic panel; Future -     Folate; Future -     Ambulatory referral to Gastroenterology -     HIV antibody; Future  Memory difficulty -     CBC w/Diff; Future -     TSH; Future -     B12; Future -     RPR; Future -     Comprehensive metabolic panel; Future -     Folate; Future  Episode of recurrent major depressive disorder, unspecified depression episode severity ([place]) -     CBC w/Diff; Future -     TSH; Future -     RPR; Future -     Comprehensive metabolic panel; Future -     Folate; Future  Urinary urgency -      Urinalysis, Routine w reflex microscopic; Future -     nitrofurantoin, macrocrystal-monohydrate, (MACROBID) 100 MG capsule; Take 1 capsule (100 mg total) by mouth 2 (two) times daily.  Unprotected sexual intercourse -     RPR; Future -     HIV antibody; Future  Other eczema -     hydrocortisone cream 0.5 %; Apply 1 application topically 2 (two) times daily.   No problem-specific Assessment & Plan notes found for this encounter.    Recent Results (from the past 2160 hour(s))  CBC w/Diff     Status: None   Collection Time: 11/17/16 12:33 PM  Result Value Ref Range   WBC 5.2 4.0 - 10.5 K/uL   RBC 4.39 3.87 - 5.11 Mil/uL   Hemoglobin 12.0 12.0 - 15.0 g/dL   HCT 37.5 36.0 - 46.0 %   MCV 85.3 78.0 - 100.0 fl   MCHC 31.9 30.0 - 36.0 g/dL   RDW 14.2 11.5 - 15.5 %   Platelets 175.0 150.0 - 400.0 K/uL   Neutrophils Relative % 70.2 43.0 - 77.0 %   Lymphocytes Relative 22.7 12.0 - 46.0 %   Monocytes Relative 5.6 3.0 - 12.0 %   Eosinophils Relative 0.9 0.0 - 5.0 %   Basophils Relative 0.6 0.0 - 3.0 %   Neutro Abs 3.7 1.4 - 7.7 K/uL   Lymphs Abs 1.2 0.7 - 4.0 K/uL   Monocytes Absolute 0.3 0.1 - 1.0 K/uL   Eosinophils Absolute 0.0 0.0 - 0.7 K/uL   Basophils Absolute 0.0 0.0 - 0.1 K/uL  TSH     Status: None   Collection Time: 11/17/16 12:33 PM  Result Value Ref Range   TSH 2.01 0.35 - 4.50 uIU/mL  B12     Status: None   Collection Time: 11/17/16 12:33 PM  Result Value Ref Range   Vitamin B-12 741 211 - 911 pg/mL  RPR     Status: None   Collection Time: 11/17/16 12:33 PM  Result Value Ref Range   RPR Ser Ql NON REAC NON REAC  Urinalysis, Routine w reflex microscopic     Status: Abnormal   Collection Time: 11/17/16 12:33 PM  Result Value Ref Range   Color, Urine YELLOW Yellow;Lt. Yellow   APPearance Sl Cloudy (A) Clear   Specific Gravity, Urine 1.020 1.000 - 1.030   pH 6.0 5.0 - 8.0   Total Protein, Urine NEGATIVE Negative   Urine Glucose NEGATIVE Negative   Ketones, ur  NEGATIVE Negative   Bilirubin Urine NEGATIVE Negative   Hgb urine dipstick SMALL (A) Negative   Urobilinogen, UA 0.2 0.0 - 1.0   Leukocytes, UA TRACE (A) Negative   Nitrite NEGATIVE Negative   WBC, UA 11-20/hpf (A) 0-2/hpf   RBC / HPF 3-6/hpf (A) 0-2/hpf   Mucus, UA Presence of (A) None   Squamous Epithelial / LPF Rare(0-4/hpf) Rare(0-4/hpf)   Bacteria, UA Rare(<10/hpf) (A) None   Hyaline Casts, UA Presence of (A) None  Comprehensive metabolic panel     Status: None   Collection Time: 11/17/16 12:33 PM  Result Value Ref Range   Sodium 135 135 - 145 mEq/L   Potassium 4.0 3.5 - 5.1 mEq/L   Chloride 102 96 - 112 mEq/L   CO2 27 19 - 32 mEq/L   Glucose, Bld 92 70 - 99 mg/dL   BUN 12 6 - 23 mg/dL   Creatinine, Ser 0.80 0.40 - 1.20 mg/dL   Total Bilirubin 0.5 0.2 - 1.2 mg/dL   Alkaline Phosphatase 65 39 - 117 U/L   AST 11 0 - 37 U/L   ALT 8 0 - 35 U/L   Total Protein 7.9 6.0 - 8.3 g/dL   Albumin 4.3 3.5 - 5.2 g/dL   Calcium 9.3 8.4 - 10.5 mg/dL   GFR 98.36 >60.00 mL/min  Folate     Status: None   Collection Time: 11/17/16 12:33 PM  Result Value Ref Range   Folate 15.9 >5.9 ng/mL  HIV antibody     Status: None (Preliminary result)   Collection Time: 11/17/16 12:33 PM  Result Value Ref Range   HIV 1&2 Ab, 4th Generation  NONREACTIVE    Comment:   PLEASE NOTE: This information has been disclosed to you from records whose confidentiality may be protected by state law. If your state requires such protection, then the state law prohibits you from making any further disclosure of the information without the specific written consent of the person to whom it pertains, or as otherwise permitted by law. A general authorization for the release of medical or other information is NOT sufficient for this purpose.   The performance of this assay has not been clinically validated in patients less than 68 years old.   For additional information please refer  to http://education.[employer].com/faq/FAQ106.  (This link is being provided for informational/educational purposes only.)      Follow up: Return in about 1 month (around 12/18/2016) for constipation and memory and rash.  Wilfred Lacy, NP

## 2016-11-18 ENCOUNTER — Encounter: Payer: Self-pay | Admitting: Nurse Practitioner

## 2016-11-18 DIAGNOSIS — G4700 Insomnia, unspecified: Secondary | ICD-10-CM

## 2016-11-18 LAB — RPR

## 2016-11-18 LAB — HIV ANTIBODY (ROUTINE TESTING W REFLEX): HIV 1&2 Ab, 4th Generation: NONREACTIVE

## 2016-11-18 MED ORDER — NITROFURANTOIN MONOHYD MACRO 100 MG PO CAPS: 100.0000 mg | ORAL_CAPSULE | Freq: Two times a day (BID) | ORAL | 0 refills | Status: DC

## 2016-11-20 MED ORDER — TRAZODONE HCL 100 MG PO TABS: 100.0000 mg | ORAL_TABLET | Freq: Every day | ORAL | 1 refills | Status: DC

## 2016-11-26 ENCOUNTER — Encounter: Payer: Self-pay | Admitting: Internal Medicine

## 2016-11-26 ENCOUNTER — Ambulatory Visit (INDEPENDENT_AMBULATORY_CARE_PROVIDER_SITE_OTHER): Payer: Managed Care, Other (non HMO) | Admitting: Internal Medicine

## 2016-11-26 DIAGNOSIS — K5909 Other constipation: Secondary | ICD-10-CM

## 2016-11-26 NOTE — Progress Notes (Addendum)
   Bonnie Farmer 51 y.o. [birth year] [id number] Referred by: Flossie Buffy, NP  Assessment & Plan:   Encounter Diagnosis  Name Primary?  . Chronic constipation    It sounds like she has a slow transit constipation problem. There is some element of inability to push when defecating though I couldn't detect any problem on rectal exam today. Will plan for:  MiraLax purge Daily MiraLax RTC June cancel if all ok Colonoscopy screening 08/2018 - place recall She prefers to avoid medications if she can. If does not respond to medical therapy would consider anorectal manometry though at this point that does not seem like it would yield significant info  I appreciate the opportunity to care for this patient. CC: Wilfred Lacy, NP   Subjective:   Chief Complaint: Constipation  HPI The patient is a very nice 51 year old African-American woman with years of constipation, she says she has been this way her whole life. Things are probably a bit worse the older she is but in general she goes for days without defecation, she frequently feels like she has to defecate but cannot produce a stool, and when she does it is incomplete. Currently she is using some Benefiber 2 teaspoons without much help. She has tried MiraLAX before I don't think she's never used on a day in and day out basis. No rectal bleeding. No urinary problems. In between bowel movements she will have lower abdominal discomfort that builds and some bloating.  Medications, allergies, past medical history, past surgical history, family history and social history are reviewed and updated in the EMR.  Review of Systems She has rheumatoid arthritis, she says she has good periods and bad periods, she is not completely compliant with her methotrexate. All other review of systems negative or as per history of present illness.  Objective:   Physical Exam BP 102/70 (BP Location: Left Arm, Patient Position: Sitting, Cuff Size:  Normal)   Pulse 88   Ht 5' 4.75" (1.645 m) Comment: height measured without shoes  Wt 167 lb (75.8 kg)   LMP 11/19/2016   BMI 28.01 kg/m   NAD Eyes anicteric Pharynx clr Neck supple no TM/mass Chest clr Cor S1s2 no rmg abd soft NT no HSM/mass Rectal  Sophia davis CMA present  NL anoderm Anal wink intact Firm formed stool - brown, nontender NL resting tone NL squeeze and appropriate abd CTR and decsent  Neuro a and o x 3  I have reviewed recent labs and primary care visits from 2018. CBC chemistries TSH all normal.

## 2016-11-26 NOTE — Patient Instructions (Signed)
Dr Carlean Purl recommends that you complete a bowel purge (to clean out your bowels). Please do the following: Purchase a bottle of Miralax over the counter as well as a box of 5 mg dulcolax tablets. Take 4 dulcolax tablets. Wait 1 hour. You will then drink 6-8 capfuls of Miralax mixed in an adequate amount of water/juice/gatorade (you may choose which of these liquids to drink) over the next 2-3 hours. You should expect results within 1 to 6 hours after completing the bowel purge.   Please take a dose of miralax nightly.   You may continue your benefiber.   Follow up with [place] in June, if all better don't need to come back.   We are putting you in our system for a colonoscopy recall for 08/2018.    I appreciate the opportunity to care for you.  Silvano Rusk, MD, [facility]

## 2016-12-05 ENCOUNTER — Encounter: Payer: Self-pay | Admitting: Nurse Practitioner

## 2016-12-05 DIAGNOSIS — R3915 Urgency of urination: Secondary | ICD-10-CM

## 2016-12-05 DIAGNOSIS — K5909 Other constipation: Secondary | ICD-10-CM

## 2016-12-08 MED ORDER — HYDROCORTISONE ACETATE 25 MG RE SUPP: 25.0000 mg | Freq: Two times a day (BID) | RECTAL | 0 refills | Status: DC

## 2016-12-18 ENCOUNTER — Ambulatory Visit: Payer: Managed Care, Other (non HMO) | Admitting: Nurse Practitioner

## 2016-12-24 ENCOUNTER — Other Ambulatory Visit (HOSPITAL_COMMUNITY)
Admission: RE | Admit: 2016-12-24 | Discharge: 2016-12-24 | Disposition: A | Discharge status: Home / Self Care | Payer: Managed Care, Other (non HMO) | Source: Ambulatory Visit | Attending: Nurse Practitioner | Admitting: Nurse Practitioner

## 2016-12-24 ENCOUNTER — Other Ambulatory Visit (INDEPENDENT_AMBULATORY_CARE_PROVIDER_SITE_OTHER): Payer: Managed Care, Other (non HMO)

## 2016-12-24 ENCOUNTER — Encounter: Payer: Self-pay | Admitting: Nurse Practitioner

## 2016-12-24 ENCOUNTER — Ambulatory Visit (INDEPENDENT_AMBULATORY_CARE_PROVIDER_SITE_OTHER): Payer: Managed Care, Other (non HMO) | Admitting: Nurse Practitioner

## 2016-12-24 VITALS — BP 120/82 | HR 76 | Temp 98.2°F | Ht 64.75 in | Wt 167.0 lb

## 2016-12-24 DIAGNOSIS — Z124 Encounter for screening for malignant neoplasm of cervix: Secondary | ICD-10-CM | POA: Diagnosis not present

## 2016-12-24 DIAGNOSIS — N3000 Acute cystitis without hematuria: Secondary | ICD-10-CM

## 2016-12-24 DIAGNOSIS — R3915 Urgency of urination: Secondary | ICD-10-CM | POA: Diagnosis present

## 2016-12-24 LAB — URINALYSIS, ROUTINE W REFLEX MICROSCOPIC
Bilirubin Urine: NEGATIVE
KETONES UR: NEGATIVE
LEUKOCYTES UA: NEGATIVE
NITRITE: POSITIVE — AB
Specific Gravity, Urine: 1.025 (ref 1.000–1.030)
TOTAL PROTEIN, URINE-UPE24: NEGATIVE
URINE GLUCOSE: NEGATIVE
Urobilinogen, UA: 0.2 (ref 0.0–1.0)
pH: 5.5 (ref 5.0–8.0)

## 2016-12-24 MED ORDER — CIPROFLOXACIN HCL 500 MG PO TABS: 500.0000 mg | ORAL_TABLET | Freq: Two times a day (BID) | ORAL | 0 refills | Status: DC

## 2016-12-24 NOTE — Patient Instructions (Addendum)
Repeat urinalysis indicates cystitis. Cipro 500mg  sent to pharmacy  You will be contacted with pap smear results.  Encourage follow up with GI for constipation.  Encourage follow up with psychiatry or psychology. Memory difficulty could also be related to depression and/or anxiety.

## 2016-12-24 NOTE — Progress Notes (Signed)
Pre visit review using our clinic review tool, if applicable. No additional management support is needed unless otherwise documented below in the visit note. 

## 2016-12-24 NOTE — Progress Notes (Signed)
Subjective:  Patient ID: Bonnie Farmer, female    DOB: 07-05-1966  Age: 52 y.o. MRN: [id number]  CC: Follow-up (1 mo fu/still having problem with bowel movement/pap?--urine sample)   HPI Constipation: Consulted with GI. Use of miralax and metamucil recommended at this time. She has not used recommended GI cleanse due to lack to time to be at home for bathroom use. Denies any new symptoms  Urinary urgency: Has not yet gone to lab for repeat urinalysis.  Needs PAP smear  Memory difficulty: Persistent difficulty with word recall.  Outpatient Medications Prior to Visit  Medication Sig Dispense Refill  . Adalimumab (HUMIRA PEN) 40 MG/0.8ML PNKT Inject 40 mg into the skin once a week.    . Biotin 5 MG CAPS Take 1 capsule by mouth daily.    . clonazePAM (KLONOPIN) 1 MG tablet 1/2 to 1 tab by mouth twice daily as needed 30 tablet 1  . fluticasone (FLONASE) 50 MCG/ACT nasal spray Place 2 sprays into both nostrils daily.    . folic acid (FOLVITE) 203 MCG tablet Take 800 mcg by mouth daily.    Marland Kitchen gabapentin (NEURONTIN) 300 MG capsule Take 1 capsule (300 mg total) by mouth at bedtime. 30 capsule 11  . hydrocortisone cream 0.5 % Apply 1 application topically 2 (two) times daily. 30 g 0  . Methotrexate, PF, 10 MG/0.2ML SOAJ Inject 1 mL into the skin once a week.    . montelukast (SINGULAIR) 10 MG tablet Take 1 tablet (10 mg total) by mouth at bedtime. 30 tablet 11  . Olopatadine HCl (PATADAY) 0.2 % SOLN 1 drop both eyes daily for allergies 2.5 mL 11  . traZODone (DESYREL) 100 MG tablet Take 1 tablet (100 mg total) by mouth at bedtime. 30 tablet 1   No facility-administered medications prior to visit.     ROS See HPI  Objective:  BP 120/82   Pulse 76   Temp 98.2 F (36.8 C)   Ht 5' 4.75" (1.645 m)   Wt 167 lb (75.8 kg)   SpO2 94%   BMI 28.01 kg/m   BP Readings from Last 3 Encounters:  12/24/16 120/82  11/26/16 102/70  11/17/16 112/86    Wt Readings from Last 3  Encounters:  12/24/16 167 lb (75.8 kg)  11/26/16 167 lb (75.8 kg)  11/17/16 167 lb (75.8 kg)    Physical Exam  Constitutional: She is oriented to person, place, and time.  Abdominal: Hernia confirmed negative in the right inguinal area and confirmed negative in the left inguinal area.  Genitourinary: Rectum normal and vagina normal. Rectal exam shows no external hemorrhoid. Pelvic exam was performed with patient supine. There is no tenderness or lesion on the right labia. There is no tenderness or lesion on the left labia. Cervix exhibits no motion tenderness and no friability.  Musculoskeletal: Normal range of motion.  Lymphadenopathy:       Right: No inguinal adenopathy present.       Left: No inguinal adenopathy present.  Neurological: She is alert and oriented to person, place, and time.  Skin: Skin is warm and dry.    Lab Results  Component Value Date   WBC 5.2 11/17/2016   HGB 12.0 11/17/2016   HCT 37.5 11/17/2016   PLT 175.0 11/17/2016   GLUCOSE 92 11/17/2016   ALT 8 11/17/2016   AST 11 11/17/2016   NA 135 11/17/2016   K 4.0 11/17/2016   CL 102 11/17/2016   CREATININE 0.80 11/17/2016   BUN  12 11/17/2016   CO2 27 11/17/2016   TSH 2.01 11/17/2016    Ct Head Wo Contrast  Result Date: 10/18/2013 CLINICAL DATA:  Headache, joint pain.  Remote history of meningitis. EXAM: CT HEAD WITHOUT CONTRAST TECHNIQUE: Contiguous axial images were obtained from the base of the skull through the vertex without intravenous contrast. COMPARISON:  None available for comparison at time of study interpretation. FINDINGS: The ventricles and sulci are normal. No intraparenchymal hemorrhage, mass effect nor midline shift. No acute large vascular territory infarcts. No abnormal extra-axial fluid collections. Basal cisterns are patent. Cerebellar tonsils at though not below the foramen magnum. No skull fracture. Visualized paranasal sinuses and mastoid air-cells are well-aerated. The included ocular  globes and orbital contents are non-suspicious. IMPRESSION: No acute intracranial process. Electronically Signed   By: Elon Alas   On: 10/18/2013 01:32    Assessment & Plan:   Anaya was seen today for follow-up.  Diagnoses and all orders for this visit:  Acute cystitis without hematuria  Encounter for Papanicolaou smear for cervical cancer screening -     Cytology - PAP   I am having Ms. Bonnie Farmer maintain her folic acid, Biotin, Adalimumab, Methotrexate (PF), fluticasone, gabapentin, montelukast, clonazePAM, Olopatadine HCl, hydrocortisone cream, and traZODone.  No orders of the defined types were placed in this encounter.   Follow-up: Return if symptoms worsen or fail to improve.  Wilfred Lacy, NP

## 2016-12-26 LAB — CYTOLOGY - PAP
DIAGNOSIS: NEGATIVE
HPV: NOT DETECTED

## 2017-01-20 NOTE — Telephone Encounter (Signed)
Patient notified

## 2017-01-30 ENCOUNTER — Other Ambulatory Visit: Payer: Self-pay | Admitting: Nurse Practitioner

## 2017-01-30 DIAGNOSIS — G4700 Insomnia, unspecified: Secondary | ICD-10-CM

## 2017-02-10 ENCOUNTER — Ambulatory Visit: Payer: Managed Care, Other (non HMO) | Admitting: Internal Medicine

## 2017-03-30 ENCOUNTER — Other Ambulatory Visit: Payer: Self-pay | Admitting: Nurse Practitioner

## 2017-03-30 DIAGNOSIS — G4700 Insomnia, unspecified: Secondary | ICD-10-CM

## 2017-03-30 NOTE — Telephone Encounter (Signed)
Please advise, ok for 90 days supply

## 2017-04-30 ENCOUNTER — Telehealth: Payer: Self-pay | Admitting: Nurse Practitioner

## 2017-04-30 DIAGNOSIS — G4700 Insomnia, unspecified: Secondary | ICD-10-CM

## 2017-04-30 NOTE — Telephone Encounter (Signed)
Received refills request from from [place] [facility] for Gabapentin Cap 300mg , Montelukast 10 mg tab.   Please advise.   Trazodone 100mg  tab (spoke with [facility] on this med we sent rx in 03/30/17 and they said she has 1 more refill left for this med--so will resend to [place] and cancel 1 refill with [facility]).

## 2017-05-01 MED ORDER — TRAZODONE HCL 100 MG PO TABS: 100.0000 mg | ORAL_TABLET | Freq: Every day | ORAL | 1 refills | Status: DC

## 2017-05-01 MED ORDER — GABAPENTIN 300 MG PO CAPS: 300.0000 mg | ORAL_CAPSULE | Freq: Every day | ORAL | 1 refills | Status: AC

## 2017-05-01 MED ORDER — MONTELUKAST SODIUM 10 MG PO TABS: 10.0000 mg | ORAL_TABLET | Freq: Every day | ORAL | 1 refills | Status: DC

## 2017-05-01 NOTE — Telephone Encounter (Signed)
Trazodone cancel from [facility].   rx sent in 90 with 1 refill.  Please help call pt and offer an appt before she runs out of her med.

## 2017-05-01 NOTE — Telephone Encounter (Signed)
Called pt to let her know. She expressed understanding but did not schedule at this time.

## 2017-05-01 NOTE — Telephone Encounter (Signed)
These were prescribed by Dr. Amil Amen. She need to request refill from her

## 2017-06-18 ENCOUNTER — Encounter: Payer: Self-pay | Admitting: Nurse Practitioner

## 2017-06-18 ENCOUNTER — Other Ambulatory Visit: Payer: Self-pay | Admitting: Nurse Practitioner

## 2017-06-18 ENCOUNTER — Other Ambulatory Visit: Payer: Managed Care, Other (non HMO)

## 2017-06-18 ENCOUNTER — Ambulatory Visit (INDEPENDENT_AMBULATORY_CARE_PROVIDER_SITE_OTHER): Payer: Managed Care, Other (non HMO) | Admitting: Nurse Practitioner

## 2017-06-18 VITALS — BP 110/80 | HR 79 | Temp 97.9°F | Ht 64.75 in | Wt 156.0 lb

## 2017-06-18 DIAGNOSIS — R300 Dysuria: Secondary | ICD-10-CM

## 2017-06-18 DIAGNOSIS — R350 Frequency of micturition: Secondary | ICD-10-CM

## 2017-06-18 DIAGNOSIS — M069 Rheumatoid arthritis, unspecified: Secondary | ICD-10-CM

## 2017-06-18 LAB — POCT URINALYSIS DIPSTICK
BILIRUBIN UA: NEGATIVE
Glucose, UA: NEGATIVE
LEUKOCYTES UA: NEGATIVE
NITRITE UA: NEGATIVE
Protein, UA: 15
Spec Grav, UA: 1.02 (ref 1.010–1.025)
UROBILINOGEN UA: 0.2 U/dL
pH, UA: 6 (ref 5.0–8.0)

## 2017-06-18 MED ORDER — PHENAZOPYRIDINE HCL 100 MG PO TABS: 100.0000 mg | ORAL_TABLET | Freq: Three times a day (TID) | ORAL | 0 refills | Status: DC | PRN

## 2017-06-18 MED ORDER — CIPROFLOXACIN HCL 250 MG PO TABS: 500.0000 mg | ORAL_TABLET | Freq: Two times a day (BID) | ORAL | 0 refills | Status: DC

## 2017-06-18 NOTE — Progress Notes (Signed)
Subjective:  Patient ID: Tris Howell, female    DOB: [birth year]  Age: 51 y.o. MRN: [id number]  CC: Urinary Tract Infection (burning,odor going on for 2 wks. )    Urinary Tract Infection   This is a new problem. The current episode started 1 to 4 weeks ago. The problem occurs every urination. The problem has been unchanged. The quality of the pain is described as burning. There has been no fever. She is sexually active. There is no history of pyelonephritis. Associated symptoms include frequency and urgency. Pertinent negatives include no chills, discharge, flank pain, hematuria, hesitancy, nausea, possible pregnancy, sweats or vomiting. She has tried increased fluids for the symptoms. The treatment provided no relief.    Outpatient Medications Prior to Visit  Medication Sig Dispense Refill  . Adalimumab (HUMIRA PEN) 40 MG/0.8ML PNKT Inject 40 mg into the skin once a week.    . fluticasone (FLONASE) 50 MCG/ACT nasal spray Place 2 sprays into both nostrils daily.    Marland Kitchen gabapentin (NEURONTIN) 300 MG capsule Take 1 capsule (300 mg total) by mouth at bedtime. 90 capsule 1  . montelukast (SINGULAIR) 10 MG tablet Take 1 tablet (10 mg total) by mouth at bedtime. 90 tablet 1  . traZODone (DESYREL) 100 MG tablet Take 1 tablet (100 mg total) by mouth at bedtime. 90 tablet 1  . Biotin 5 MG CAPS Take 1 capsule by mouth daily.    . clonazePAM (KLONOPIN) 1 MG tablet 1/2 to 1 tab by mouth twice daily as needed (Patient not taking: Reported on 06/18/2017) 30 tablet 1  . folic acid (FOLVITE) 629 MCG tablet Take 800 mcg by mouth daily.    . hydrocortisone cream 0.5 % Apply 1 application topically 2 (two) times daily. (Patient not taking: Reported on 06/18/2017) 30 g 0  . Methotrexate, PF, 10 MG/0.2ML SOAJ Inject 1 mL into the skin once a week.    . Olopatadine HCl (PATADAY) 0.2 % SOLN 1 drop both eyes daily for allergies (Patient not taking: Reported on 06/18/2017) 2.5 mL 11  . ciprofloxacin (CIPRO) 500  MG tablet Take 1 tablet (500 mg total) by mouth 2 (two) times daily. (Patient not taking: Reported on 06/18/2017) 14 tablet 0   No facility-administered medications prior to visit.     ROS See HPI  Objective:  BP 110/80   Pulse 79   Temp 97.9 F (36.6 C)   Ht 5' 4.75" (1.645 m)   Wt 156 lb (70.8 kg)   SpO2 99%   BMI 26.16 kg/m   BP Readings from Last 3 Encounters:  06/18/17 110/80  12/24/16 120/82  11/26/16 102/70    Wt Readings from Last 3 Encounters:  06/18/17 156 lb (70.8 kg)  12/24/16 167 lb (75.8 kg)  11/26/16 167 lb (75.8 kg)    Physical Exam  Constitutional: She is oriented to person, place, and time. No distress.  Cardiovascular: Normal rate.   Pulmonary/Chest: Effort normal.  Abdominal: Soft. She exhibits no distension. There is no tenderness.  Neurological: She is alert and oriented to person, place, and time.  Skin: Skin is warm and dry.  Vitals reviewed.   Lab Results  Component Value Date   WBC 5.2 11/17/2016   HGB 12.0 11/17/2016   HCT 37.5 11/17/2016   PLT 175.0 11/17/2016   GLUCOSE 92 11/17/2016   ALT 8 11/17/2016   AST 11 11/17/2016   NA 135 11/17/2016   K 4.0 11/17/2016   CL 102 11/17/2016   CREATININE 0.80  11/17/2016   BUN 12 11/17/2016   CO2 27 11/17/2016   TSH 2.01 11/17/2016    No results found.  Assessment & Plan:   Clarabelle was seen today for urinary tract infection.  Diagnoses and all orders for this visit:  Frequent urination -     POCT urinalysis dipstick -     ciprofloxacin (CIPRO) 250 MG tablet; Take 2 tablets (500 mg total) by mouth 2 (two) times daily. -     Urine Culture; Future  Dysuria -     POCT urinalysis dipstick -     ciprofloxacin (CIPRO) 250 MG tablet; Take 2 tablets (500 mg total) by mouth 2 (two) times daily. -     Urine Culture; Future -     phenazopyridine (PYRIDIUM) 100 MG tablet; Take 1 tablet (100 mg total) by mouth 3 (three) times daily as needed for pain (with food).   I have changed Ms.  McLachlan's ciprofloxacin. I am also having her start on phenazopyridine. Additionally, I am having her maintain her folic acid, Biotin, Adalimumab, Methotrexate (PF), fluticasone, clonazePAM, Olopatadine HCl, hydrocortisone cream, gabapentin, montelukast, and traZODone.  Meds ordered this encounter  Medications  . ciprofloxacin (CIPRO) 250 MG tablet    Sig: Take 2 tablets (500 mg total) by mouth 2 (two) times daily.    Dispense:  6 tablet    Refill:  0    Order Specific Question:   Supervising Provider    Answer:   Binnie Rail [[id number]]  . phenazopyridine (PYRIDIUM) 100 MG tablet    Sig: Take 1 tablet (100 mg total) by mouth 3 (three) times daily as needed for pain (with food).    Dispense:  10 tablet    Refill:  0    Order Specific Question:   Supervising Provider    Answer:   Binnie Rail [[id number]]    Follow-up: No Follow-up on file.  Wilfred Lacy, NP

## 2017-06-18 NOTE — Progress Notes (Signed)
lefl

## 2017-06-18 NOTE — Patient Instructions (Signed)
Your urine was sent for culture. You will be called with results.  Push oral hydration

## 2017-06-19 LAB — URINE CULTURE
MICRO NUMBER: 81196799
RESULT: NO GROWTH
SPECIMEN QUALITY: ADEQUATE

## 2017-06-19 MED ORDER — CIPROFLOXACIN HCL 500 MG PO TABS: 500.0000 mg | ORAL_TABLET | Freq: Two times a day (BID) | ORAL | 0 refills | Status: DC

## 2017-06-19 NOTE — Addendum Note (Signed)
Addended by: Wilfred Lacy L on: 06/19/2017 11:22 AM   Modules accepted: Orders

## 2017-06-22 ENCOUNTER — Ambulatory Visit (INDEPENDENT_AMBULATORY_CARE_PROVIDER_SITE_OTHER): Payer: Managed Care, Other (non HMO) | Admitting: Nurse Practitioner

## 2017-06-22 ENCOUNTER — Encounter: Payer: Self-pay | Admitting: Nurse Practitioner

## 2017-06-22 ENCOUNTER — Other Ambulatory Visit: Payer: Managed Care, Other (non HMO)

## 2017-06-22 ENCOUNTER — Other Ambulatory Visit (HOSPITAL_COMMUNITY)
Admission: RE | Admit: 2017-06-22 | Discharge: 2017-06-22 | Disposition: A | Discharge status: Home / Self Care | Payer: Managed Care, Other (non HMO) | Source: Ambulatory Visit | Attending: Nurse Practitioner | Admitting: Nurse Practitioner

## 2017-06-22 VITALS — BP 112/80 | HR 73 | Temp 97.9°F | Ht 64.75 in | Wt 155.0 lb

## 2017-06-22 DIAGNOSIS — F339 Major depressive disorder, recurrent, unspecified: Secondary | ICD-10-CM

## 2017-06-22 DIAGNOSIS — R350 Frequency of micturition: Secondary | ICD-10-CM

## 2017-06-22 MED ORDER — VENLAFAXINE HCL ER 75 MG PO CP24: 75.0000 mg | ORAL_CAPSULE | Freq: Every day | ORAL | 1 refills | Status: DC

## 2017-06-22 NOTE — Progress Notes (Signed)
Subjective:  Patient ID: Bonnie Farmer, female    DOB: Dec 15, 1965  Age: 51 y.o. MRN: [id number]  CC: Medication Refill (antidepression consult and still has frequent urination. )   Depression         This is a chronic problem.  The current episode started more than 1 year ago.   The onset quality is gradual.   The problem occurs daily.  The problem has been waxing and waning since onset.  Associated symptoms include decreased concentration, fatigue, insomnia, irritable, restlessness, decreased interest, appetite change and sad.  Associated symptoms include no suicidal ideas.  Past treatments include SSRIs - Selective serotonin reuptake inhibitors (and trazodone).  Compliance with treatment is poor.  Past compliance problems include difficulty with treatment plan and medication issues.  Previous treatment provided no relief relief.   Pertinent negatives include no suicide attempts. ongoing counseling with psychology.  Increased urinary frequency: Onset 3weeks. Normal urinalysis and urine culture. States she had similar symptoms several years ago, she was evaluated by urology who prescribed myebertriq. Medication caused vaginal dryness and cough. She does not want to return to urology at this time. Denies any vaginal symptoms. Will like urine STI testing.  Outpatient Medications Prior to Visit  Medication Sig Dispense Refill  . Adalimumab (HUMIRA PEN) 40 MG/0.8ML PNKT Inject 40 mg into the skin once a week.    . fluticasone (FLONASE) 50 MCG/ACT nasal spray Place 2 sprays into both nostrils daily.    Marland Kitchen gabapentin (NEURONTIN) 300 MG capsule Take 1 capsule (300 mg total) by mouth at bedtime. 90 capsule 1  . leflunomide (ARAVA) 10 MG tablet Take 1 tablet (10 mg total) by mouth daily.    . montelukast (SINGULAIR) 10 MG tablet Take 1 tablet (10 mg total) by mouth at bedtime. 90 tablet 1  . traZODone (DESYREL) 100 MG tablet Take 1 tablet (100 mg total) by mouth at bedtime. 90 tablet 1  . Biotin  5 MG CAPS Take 1 capsule by mouth daily.    . folic acid (FOLVITE) 782 MCG tablet Take 800 mcg by mouth daily.    . Methotrexate, PF, 10 MG/0.2ML SOAJ Inject 1 mL into the skin once a week.    . ciprofloxacin (CIPRO) 500 MG tablet Take 1 tablet (500 mg total) by mouth 2 (two) times daily. (Patient not taking: Reported on 06/22/2017) 6 tablet 0  . phenazopyridine (PYRIDIUM) 100 MG tablet Take 1 tablet (100 mg total) by mouth 3 (three) times daily as needed for pain (with food). (Patient not taking: Reported on 06/22/2017) 10 tablet 0   No facility-administered medications prior to visit.     ROS See HPI  Objective:  BP 112/80   Pulse 73   Temp 97.9 F (36.6 C)   Ht 5' 4.75" (1.645 m)   Wt 155 lb (70.3 kg)   SpO2 98%   BMI 25.99 kg/m   BP Readings from Last 3 Encounters:  06/22/17 112/80  06/18/17 110/80  12/24/16 120/82    Wt Readings from Last 3 Encounters:  06/22/17 155 lb (70.3 kg)  06/18/17 156 lb (70.8 kg)  12/24/16 167 lb (75.8 kg)    Physical Exam  Constitutional: She is oriented to person, place, and time. She is irritable.  Cardiovascular: Normal rate.   Pulmonary/Chest: Effort normal.  Neurological: She is alert and oriented to person, place, and time.  Psychiatric: She has a normal mood and affect. Her behavior is normal.  Vitals reviewed.   Lab Results  Component  Value Date   WBC 5.2 11/17/2016   HGB 12.0 11/17/2016   HCT 37.5 11/17/2016   PLT 175.0 11/17/2016   GLUCOSE 92 11/17/2016   ALT 8 11/17/2016   AST 11 11/17/2016   NA 135 11/17/2016   K 4.0 11/17/2016   CL 102 11/17/2016   CREATININE 0.80 11/17/2016   BUN 12 11/17/2016   CO2 27 11/17/2016   TSH 2.01 11/17/2016    No results found.  Assessment & Plan:   Bonnie Farmer was seen today for medication refill.  Diagnoses and all orders for this visit:  Episode of recurrent major depressive disorder, unspecified depression episode severity (HCC) -     venlafaxine XR (EFFEXOR-XR) 75 MG 24 hr  capsule; Take 1 capsule (75 mg total) by mouth daily with breakfast.  Frequent urination -     Urine cytology ancillary only; Future   I have discontinued Bonnie Farmer's phenazopyridine and ciprofloxacin. I am also having her start on venlafaxine XR. Additionally, I am having her maintain her folic acid, Biotin, Adalimumab, Methotrexate (PF), fluticasone, gabapentin, montelukast, traZODone, and leflunomide.  Meds ordered this encounter  Medications  . venlafaxine XR (EFFEXOR-XR) 75 MG 24 hr capsule    Sig: Take 1 capsule (75 mg total) by mouth daily with breakfast.    Dispense:  30 capsule    Refill:  1    Order Specific Question:   Supervising Provider    Answer:   Cassandria Anger [[id number]]    Follow-up: Return in about 4 weeks (around 07/20/2017) for Depression ([place] location).  Wilfred Lacy, NP

## 2017-06-24 LAB — URINE CYTOLOGY ANCILLARY ONLY
Chlamydia: NEGATIVE
Neisseria Gonorrhea: NEGATIVE
Trichomonas: NEGATIVE

## 2017-06-24 NOTE — Patient Instructions (Signed)
Continue counseling sessions.

## 2017-06-29 ENCOUNTER — Encounter: Payer: Self-pay | Admitting: Nurse Practitioner

## 2017-06-29 DIAGNOSIS — F419 Anxiety disorder, unspecified: Secondary | ICD-10-CM

## 2017-06-29 DIAGNOSIS — F339 Major depressive disorder, recurrent, unspecified: Secondary | ICD-10-CM

## 2017-07-09 ENCOUNTER — Encounter: Payer: Self-pay | Admitting: Nurse Practitioner

## 2017-07-09 DIAGNOSIS — Z91010 Allergy to peanuts: Secondary | ICD-10-CM

## 2017-07-09 MED ORDER — EPINEPHRINE 0.3 MG/0.3ML IJ SOAJ: 0.3000 mg | Freq: Once | INTRAMUSCULAR | 0 refills | Status: AC

## 2017-07-14 ENCOUNTER — Encounter: Payer: Self-pay | Admitting: Nurse Practitioner

## 2017-07-21 ENCOUNTER — Ambulatory Visit: Payer: Managed Care, Other (non HMO) | Admitting: Nurse Practitioner

## 2017-07-21 NOTE — Telephone Encounter (Signed)
Spoke with pt and gave her [facility] outpatient [facility] phone number

## 2017-07-31 ENCOUNTER — Encounter: Payer: Self-pay | Admitting: Nurse Practitioner

## 2017-07-31 DIAGNOSIS — F419 Anxiety disorder, unspecified: Secondary | ICD-10-CM

## 2017-07-31 DIAGNOSIS — F339 Major depressive disorder, recurrent, unspecified: Secondary | ICD-10-CM

## 2017-08-07 MED ORDER — ESCITALOPRAM OXALATE 10 MG PO TABS: 10.0000 mg | ORAL_TABLET | Freq: Every day | ORAL | 0 refills | Status: DC

## 2017-08-07 NOTE — Telephone Encounter (Signed)
Called [facility] and cancel this rx.

## 2017-08-20 ENCOUNTER — Other Ambulatory Visit: Payer: Self-pay | Admitting: Internal Medicine

## 2017-08-25 ENCOUNTER — Other Ambulatory Visit: Payer: Self-pay | Admitting: Nurse Practitioner

## 2017-08-25 DIAGNOSIS — G4700 Insomnia, unspecified: Secondary | ICD-10-CM
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
## 2017-09-03 ENCOUNTER — Encounter (HOSPITAL_COMMUNITY): Payer: Self-pay | Admitting: Psychiatry

## 2017-09-03 ENCOUNTER — Ambulatory Visit (INDEPENDENT_AMBULATORY_CARE_PROVIDER_SITE_OTHER): Payer: 59 | Admitting: Psychiatry

## 2017-09-03 VITALS — BP 130/89 | HR 75 | Ht 64.0 in | Wt 161.0 lb

## 2017-09-03 DIAGNOSIS — Z91411 Personal history of adult psychological abuse: Secondary | ICD-10-CM

## 2017-09-03 DIAGNOSIS — F331 Major depressive disorder, recurrent, moderate: Secondary | ICD-10-CM

## 2017-09-03 DIAGNOSIS — F419 Anxiety disorder, unspecified: Secondary | ICD-10-CM

## 2017-09-03 DIAGNOSIS — F411 Generalized anxiety disorder: Secondary | ICD-10-CM

## 2017-09-03 DIAGNOSIS — Z818 Family history of other mental and behavioral disorders: Secondary | ICD-10-CM

## 2017-09-03 DIAGNOSIS — Z87891 Personal history of nicotine dependence: Secondary | ICD-10-CM

## 2017-09-03 DIAGNOSIS — R450 Nervousness: Secondary | ICD-10-CM

## 2017-09-03 DIAGNOSIS — G4700 Insomnia, unspecified: Secondary | ICD-10-CM | POA: Diagnosis not present

## 2017-09-03 DIAGNOSIS — Z813 Family history of other psychoactive substance abuse and dependence: Secondary | ICD-10-CM

## 2017-09-03 MED ORDER — DULOXETINE HCL 20 MG PO CPEP: ORAL_CAPSULE | ORAL | 0 refills | Status: DC

## 2017-09-03 NOTE — Patient Instructions (Signed)
1. Start duloxetine 20 mg daily for one week, then 40 mg daily  2.  Return to clinic in one month for 30 mins
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
## 2017-10-05 ENCOUNTER — Ambulatory Visit (HOSPITAL_COMMUNITY): Payer: Self-pay | Admitting: Psychiatry

## 2018-01-21 ENCOUNTER — Telehealth: Payer: Self-pay | Admitting: Neurology

## 2018-01-21 ENCOUNTER — Encounter: Payer: Self-pay | Admitting: Diagnostic Neuroimaging

## 2018-01-21 ENCOUNTER — Encounter: Payer: Self-pay | Admitting: Neurology

## 2018-01-21 ENCOUNTER — Ambulatory Visit (INDEPENDENT_AMBULATORY_CARE_PROVIDER_SITE_OTHER): Payer: No Typology Code available for payment source | Admitting: Neurology

## 2018-01-21 VITALS — BP 127/88 | HR 81 | Ht 65.0 in | Wt 175.0 lb

## 2018-01-21 DIAGNOSIS — G5730 Lesion of lateral popliteal nerve, unspecified lower limb: Secondary | ICD-10-CM | POA: Diagnosis not present

## 2018-01-21 DIAGNOSIS — G629 Polyneuropathy, unspecified: Secondary | ICD-10-CM | POA: Diagnosis not present

## 2018-01-21 DIAGNOSIS — R200 Anesthesia of skin: Secondary | ICD-10-CM

## 2018-01-21 DIAGNOSIS — I776 Arteritis, unspecified: Secondary | ICD-10-CM | POA: Diagnosis not present

## 2018-01-21 DIAGNOSIS — M329 Systemic lupus erythematosus, unspecified: Secondary | ICD-10-CM | POA: Diagnosis not present

## 2018-01-21 DIAGNOSIS — M069 Rheumatoid arthritis, unspecified: Secondary | ICD-10-CM

## 2018-01-21 DIAGNOSIS — R419 Unspecified symptoms and signs involving cognitive functions and awareness: Secondary | ICD-10-CM

## 2018-01-21 DIAGNOSIS — R4701 Aphasia: Secondary | ICD-10-CM

## 2018-01-21 NOTE — Progress Notes (Signed)
[facility]    Provider:  Dr Jaynee Eagles Referring Provider: Jenel Lucks, PA-C Primary Care Physician:  Jenel Lucks, PA-C  CC:  Foot numbness and memory problems, word-finding difficulties  HPI:  Bonnie Farmer is a 52 y.o. female here as a referral from Dr. Lorayne Marek for numbness and tingling of the foot.  She is treated by rheumatology for lupus and rheumatoid arthritis.  She also has a history of depression, anxiety, neck pain radiculopathy and patient previously had an MRI for radicular symptoms down her left arm MRI revealed several bulging disks in her neck over 10 years ago.  She has began having electric shock sensations in her left arm again.  She is currently managed by rheumatology.  She is currently on Humira and [place].  Overall well controlled.  In the last several a year she has had memory changes.  She is here alone. 6 months ago at a pedicure felt numbness on the top of the foot very intense, everything she put on affected it. She still feels numb, shoes are not tight. She crosses her legs a lot. Also beginning on the dorsum of the right foot with some numbness. For 3-4 months she has tingling down the pouter side of the leg. If she is sitting too long she feels. Starts getting tingling. Standing and moving around makes it better. No symptoms at night. Moving makes it better, sitting for longer periods makes it worse. The symptoms are continuous on the top of the left foot. Improved on the left, but progressive on the right and newer. No significant weight changes recently, she lost weight last summer but didn;t coincide with the symptoms and she gained it back. She also has memory changes over the last few years, she has difficulty with names and getting words out, she used to be a multi-tasker, good with words, can't remember the names of things, she has had very brief moments of confusion. She recently passed her boards.   Reviewed notes, labs  and imaging from outside physicians, which showed:   Ct 2015 showed No acute intracranial abnormalities including mass lesion or mass effect, hydrocephalus, extra-axial fluid collection, midline shift, hemorrhage, or acute infarction, large ischemic events (personally reviewed images)   Order TSH, B12, CMP but I do not have the records they did not send me the results.  Received records: B12 774, TSH nml  6 months ago patient noticed hypersensitivity on the top of her foot while getting a pedicure.  Anything that touches her foot irritates it.  Also has tingling in both outer feet switches back and forth.  She was started on gabapentin years ago when she was diagnosed with lupus because she had tingling in her feet.  She recently developed numbness tingling and extreme skin sensitivity over her left dorsal surface of foot, she also complains of decreased memory difficulty with word selection which is progressively worsening.  Review of Systems: Patient complains of symptoms per HPI as well as the following symptoms: Numbness, anxiety, joint pain, joint swelling, allergies, rheumatoid arthritis, lupus. Pertinent negatives and positives per HPI. All others negative.   Social History   Socioeconomic History  . Marital status: Single    Spouse name: Not on file  . Number of children: 1  . Years of education: trade  . Highest education level: Not on file  Occupational History  . Occupation: Facilities manager: [employer]  Social Needs  . Financial resource strain: Not  on file  . Food insecurity:    Worry: Not on file    Inability: Not on file  . Transportation needs:    Medical: Not on file    Non-medical: Not on file  Tobacco Use  . Smoking status: Former Smoker    Types: Cigarettes    Last attempt to quit: 06/09/2011    Years since quitting: 6.6  . Smokeless tobacco: Never Used  . Tobacco comment: approx 1 pack per week off and on   Substance and Sexual Activity    . Alcohol use: Yes    Alcohol/week: 1.2 oz    Types: 2 Glasses of wine per week    Comment: social  . Drug use: No  . Sexual activity: Yes    Partners: Male    Birth control/protection: Condom  Lifestyle  . Physical activity:    Days per week: Not on file    Minutes per session: Not on file  . Stress: Not on file  Relationships  . Social connections:    Talks on phone: Not on file    Gets together: Not on file    Attends religious service: Not on file    Active member of club or organization: Not on file    Attends meetings of clubs or organizations: Not on file    Relationship status: Not on file  . Intimate partner violence:    Fear of current or ex partner: Not on file    Emotionally abused: Not on file    Physically abused: Not on file    Forced sexual activity: Not on file  Other Topics Concern  . Not on file  Social History Narrative   Originally from [place]--grew up in [place], lived in [place] to [place] in 2005   [facility]   Lives with daughter in [place]. Daughter born 82    Family History  Problem Relation Age of Onset  . Heart disease Mother   . Mental illness Mother        depression and anxiety  . Congestive Heart Failure Mother   . Rheum arthritis Mother   . Arthritis Father   . Hyperlipidemia Father   . Other Father        prostate issues  . Drug abuse Brother   . Mental illness Brother   . Bipolar disorder Brother     Past Medical History:  Diagnosis Date  . Allergy    year round, also food & drug allergies  . Anxiety   . Chronic constipation   . Degenerative cervical disc   . GERD without esophagitis   . Lupus ([place])   . MDD (major depressive disorder), recurrent episode, moderate ([place])   . Positive TB test   . RA (rheumatoid arthritis) ([place])   . SLE (systemic lupus erythematosus) ([place])   . Tinea versicolor     Past Surgical History:  Procedure Laterality Date  . RENAL BIOPSY Left 5 years prior    for lupus  . TONSILLECTOMY    . UPPER GASTROINTESTINAL ENDOSCOPY      Current Outpatient Medications  Medication Sig Dispense Refill  . Adalimumab (HUMIRA PEN) 40 MG/0.8ML PNKT Inject 40 mg into the skin once a week.    . fluticasone (FLONASE) 50 MCG/ACT nasal spray Place 2 sprays into both nostrils daily.    Marland Kitchen gabapentin (NEURONTIN) 300 MG capsule Take 1 capsule (300 mg total) by mouth at bedtime. 90 capsule 1  .  leflunomide (ARAVA) 10 MG tablet Take 1 tablet (10 mg total) by mouth daily.    . montelukast (SINGULAIR) 10 MG tablet Take 1 tablet (10 mg total) by mouth at bedtime. 90 tablet 1  . OVER THE COUNTER MEDICATION Take by mouth daily. OTC Natural Supplement    . traZODone (DESYREL) 100 MG tablet Take 100 mg by mouth at bedtime.     No current facility-administered medications for this visit.     Allergies as of 01/21/2018 - Review Complete 01/21/2018  Allergen Reaction Noted  . Peanuts [peanut oil] Anaphylaxis 06/08/2013  . Soy allergy Anaphylaxis 06/08/2013  . Penicillins  06/08/2013  . Sulfa antibiotics Rash 06/08/2013    Vitals: BP 127/88 (BP Location: Right Arm, Patient Position: Sitting)   Pulse 81   Ht 5\' 5"  (1.651 m)   Wt 175 lb (79.4 kg)   BMI 29.12 kg/m  Last Weight:  Wt Readings from Last 1 Encounters:  01/21/18 175 lb (79.4 kg)   Last Height:   Ht Readings from Last 1 Encounters:  01/21/18 5\' 5"  (1.651 m)   Physical exam: Exam: Gen: NAD, conversant, well nourised, obese, well groomed                     CV: RRR, no MRG. No Carotid Bruits. No peripheral edema, warm, nontender Eyes: Conjunctivae clear without exudates or hemorrhage  Neuro: Detailed Neurologic Exam  Speech:    Speech is normal; fluent and spontaneous with normal comprehension.  Cognition:    The patient is oriented to person, place, and time;     recent and remote memory intact;     language fluent;     normal attention, concentration,     fund of knowledge Cranial Nerves:     The pupils are equal, round, and reactive to light. The fundi are normal and spontaneous venous pulsations are present. Visual fields are full to finger confrontation. Extraocular movements are intact. Trigeminal sensation is intact and the muscles of mastication are normal. The face is symmetric. The palate elevates in the midline. Hearing intact. Voice is normal. Shoulder shrug is normal. The tongue has normal motion without fasciculations.   Coordination:    Normal finger to nose and heel to shin. Normal rapid alternating movements.   Gait:    Heel-toe and tandem gait are normal.   Motor Observation:    No asymmetry, no atrophy, and no involuntary movements noted. Tone:    Normal muscle tone.    Posture:    Posture is normal. normal erect    Strength:    Strength is V/V in the upper and lower limbs.      Sensation: decreased sensation dorsum of foot in the distro of common peroneal nerve     Reflex Exam:  DTR's:    Deep tendon reflexes in the upper and lower extremities are normal bilaterally.   Toes:    The toes are downgoing bilaterally.   Clonus:    Clonus is absent.       Assessment/Plan:  Patient with sensory changes in the Peroneal nerve distributions. Also having episodes of confusion, aphasia, memory changes.    - Check a few more labs for etiologies of neuropathy - MRI brain and an MRA of the head to rule out strokes, vasculitis, seizure focus or other intracranial etiologies especially looking at the blood vessels for vasculitis given her neuropathy and autoimmune disorders; concerning is aphasia, short periods of confusion, memory loss - EMG/NCS if symptoms  worsen or don;t improve, will hold off per patient - Discussed conservative measures such as not crossing or doing anything to compress the peroneal nerve, she is a habitual nerve crosser. If symptoms worsen needs emg/ncs for vasculitic etiologies - Memory changes Likely normal cognitive aging but need to  rule out other etiologies given extensive rheumatologic disorders that can be involved in vascular causes of brain disease and nerve disease.  Orders Placed This Encounter  Procedures  . MR BRAIN W WO CONTRAST  . MR MRA HEAD WO CONTRAST  . Heavy metals, blood  . Vitamin B6  . Vitamin B1  . Folate   Cc: Jenel Lucks, PA-C  Sarina Ill, MD  [facility] [address] [place], [place] [address]  Phone [phone] Fax [phone]

## 2018-01-21 NOTE — Patient Instructions (Signed)
Common Peroneal Nerve Entrapment Common peroneal nerve entrapment is a condition that can make it hard to lift a foot. The condition results from pressure on a nerve in the lower leg called the common peroneal nerve. Your common peroneal nerve provides feeling to your outer lower leg and foot. It also supplies the muscles that move your foot and toes upward and outward. What are the causes? This condition may be caused by:  A hard, direct hit to the side of the lower leg.  Swelling from a knee injury.  A break (fracture) in one of the lower leg bones.  Wearing a boot or cast that ends just below the knee.  A growth or cyst near the nerve.  What increases the risk? This condition is more likely to develop in people who play:  Contact sports, such as football or hockey.  Sports in which you wear high and stiff boots, such as skiing.  What are the signs or symptoms? Symptoms of this condition include:  Trouble lifting your foot up.  Tripping often.  Your foot hitting the ground harder than normal as you walk.  Numbness, tingling, or pain in the outside of the knee, outside of the lower leg, and top of the foot.  Sensitivity to pressure on the front or side of the leg.  How is this diagnosed? This condition may be diagnosed based on:  Your symptoms.  Your medical history.  A physical exam.  Tests, such as: ? An X-ray to check the bones of your knee and leg. ? MRI to check tendons that attach to the side of your knee. ? An electromyogram (EMG) to check your nerves.  During your physical exam, your health care provider will check for numbness in your leg and test the strength of your lower leg muscles. He or she may tap the side of your lower leg to see if that causes tingling. How is this treated? Treatment for this condition may include:  Avoiding activities that make symptoms worse.  Using a brace to hold up your foot and toes.  Taking anti-inflammatory pain  medicines to relieve swelling and reduce pain.  Having medicines injected into your ankle joint to reduce pain and swelling.  Physical therapy. This involves doing exercises.  Returning gradually to full activity.  Surgery to take pressure off the nerve. This may be needed if there is no improvement after 2-3 months or if there is a growth pushing on the nerve.  Follow these instructions at home: If you have a brace:  Wear it as told by your health care provider. Remove it only as told by your health care provider.  Loosen the brace if your toes tingle, become numb, or turn cold and blue.  Keep the brace clean.  If the brace is not waterproof: ? Do not let it get wet. ? Cover it with a watertight covering when you take a bath or a shower.  Ask your health care provider when it is safe to drive with a brace on your foot. Activity  Return to your normal activities as told by your health care provider. Ask your health care provider what activities are safe for you.  Do not do any activities that make pain or swelling worse.  Do exercises as told by your health care provider. General instructions  Take over-the-counter and prescription medicines only as told by your health care provider.  Do not put your full weight on your knee until your health care provider   says you can.  Keep all follow-up visits as told by your health care provider. This is important. How is this prevented?  Wear supportive footwear that is appropriate for your athletic activity.  Avoid athletic activities that cause ankle pain or swelling.  Wear protective padding over your lower legs when playing contact sports.  Make sure your boots do not put extra pressure on the area just below your knees.  Do not sit cross-legged for long periods of time. Contact a health care provider if:  Your symptoms do not get better in 2-3 months.  The weakness or numbness in your leg or foot gets worse. This  information is not intended to replace advice given to you by your health care provider. Make sure you discuss any questions you have with your health care provider. Document Released: 08/11/2005 Document Revised: 04/15/2016 Document Reviewed: 06/29/2015 Elsevier Interactive Patient Education  2018 [employer]vier Inc.  

## 2018-01-21 NOTE — Telephone Encounter (Signed)
medcost order sent to [facility]. They will obtain the auth and will reach out to the pt to schedule.  °

## 2018-01-24 LAB — VITAMIN B6: VITAMIN B6: 19.4 ug/L (ref 2.0–32.8)

## 2018-01-24 LAB — HEAVY METALS, BLOOD
ARSENIC: 7 ug/L (ref 2–23)
LEAD, BLOOD: NOT DETECTED ug/dL (ref 0–4)
Mercury: NOT DETECTED ug/L (ref 0.0–14.9)

## 2018-01-24 LAB — VITAMIN B1: THIAMINE: 132.9 nmol/L (ref 66.5–200.0)

## 2018-01-24 LAB — FOLATE: Folate: >20 ng/mL (ref >3.0)

## 2018-01-25 ENCOUNTER — Telehealth: Payer: Self-pay | Admitting: *Deleted

## 2018-01-25 ENCOUNTER — Encounter: Payer: Self-pay | Admitting: Neurology

## 2018-01-25 NOTE — Telephone Encounter (Addendum)
Called pt & LVM asking for call back. When she calls back, please inform pt that her labs are normal.    ----- Message from Melvenia Beam, MD sent at 01/24/2018  7:14 PM EDT ----- Labs normal thanks

## 2018-01-25 NOTE — Telephone Encounter (Signed)
Emailed pt through her active mychart account informing pt that her labs are normal and to contact us if she has any questions.

## 2018-03-22 DIAGNOSIS — B003 Herpesviral meningitis: Secondary | ICD-10-CM

## 2018-03-22 HISTORY — DX: Herpesviral meningitis: B00.3

## 2018-08-25 DIAGNOSIS — R0789 Other chest pain: Secondary | ICD-10-CM

## 2018-08-25 HISTORY — DX: Other chest pain: R07.89

## 2018-11-03 ENCOUNTER — Encounter: Payer: Self-pay | Admitting: Internal Medicine

## 2019-12-21 ENCOUNTER — Encounter: Payer: Self-pay | Admitting: Internal Medicine

## 2019-12-30 ENCOUNTER — Ambulatory Visit: Payer: Self-pay | Admitting: Internal Medicine

## 2020-01-18 MED FILL — traMADol HCL 50 MG TABS: 50 | 30 days supply | Qty: 60 | Fill #0

## 2020-02-10 ENCOUNTER — Other Ambulatory Visit (HOSPITAL_COMMUNITY): Payer: Self-pay | Admitting: Physician Assistant

## 2020-02-10 MED FILL — AMLODIPINE BESYLATE 10 MG T: 10 | 90 days supply | Qty: 90 | Fill #0

## 2020-02-13 MED FILL — LEFLUNOMIDE 20 MG TABLET: 20 | 30 days supply | Qty: 30 | Fill #0

## 2020-02-17 ENCOUNTER — Other Ambulatory Visit: Payer: Self-pay

## 2020-02-17 ENCOUNTER — Encounter: Payer: Self-pay | Admitting: Emergency Medicine

## 2020-02-17 ENCOUNTER — Ambulatory Visit (INDEPENDENT_AMBULATORY_CARE_PROVIDER_SITE_OTHER): Payer: No Typology Code available for payment source | Admitting: Emergency Medicine

## 2020-02-17 ENCOUNTER — Ambulatory Visit (INDEPENDENT_AMBULATORY_CARE_PROVIDER_SITE_OTHER): Payer: No Typology Code available for payment source

## 2020-02-17 DIAGNOSIS — R0600 Dyspnea, unspecified: Secondary | ICD-10-CM | POA: Insufficient documentation

## 2020-02-17 DIAGNOSIS — R0602 Shortness of breath: Secondary | ICD-10-CM

## 2020-02-17 NOTE — Assessment & Plan Note (Signed)
Progressive dyspnea with exertion.  She has had a reassuring cardiac evaluation in [facility].  She has heard wheeze associated with shortness of breath, has also had some right-sided pleuritic pain.  Consider obstructive disease.  Certainly at risk for interstitial changes due to her RA/SLE, immunomodulatory medications.  Needs PFT, chest x-ray now.  Depending on the results we may decide to pursue high-resolution CT scan of the chest.  Chest x-ray today We will perform pulmonary function testing at your next office visit and then review the results together Follow with Dr. Lamonte Sakai next available with full pulmonary function testing on the same day.

## 2020-02-17 NOTE — Patient Instructions (Signed)
Chest x-ray today We will perform pulmonary function testing at your next office visit and then review the results together Follow with Dr. Lamonte Sakai next available with full pulmonary function testing on the same day.

## 2020-02-17 NOTE — Progress Notes (Signed)
Subjective:    Patient ID: Bonnie Farmer, female    DOB: 1966/02/21, 54 y.o.   MRN: [id number]  HPI 54 year old former minimal smoker (1 pack year) with a history of SLE and rheumatoid arthritis, allergic rhinitis, GERD.  Also with a history of latent TB (never treated).  She is managed on [place] currently.  She has been on Plaquenil and Humira in the past but not currently. She is referred today for shortness of breath.  She reports that she has been experiencing some SOB with exertion, walking, sometimes in conversation. Has to stop to rest over the last 6 months. Stairs difficult. She can feel some R mid-chest sharp pain, pleuritic pain. She has some cough - not always associated with the dyspnea. She hears wheeze with exertion, sometimes while in bed. She has gained wt 10 lbs over the last year.   She has seen cardiology at [facility], had a reassuring TTE and ECG.    Review of Systems As per HPI  Past Medical History:  Diagnosis Date   Allergy    year round, also food & drug allergies   Anxiety    Chronic constipation    Degenerative cervical disc    GERD without esophagitis    Insomnia    Lupus (HCC)    MDD (major depressive disorder), recurrent episode, moderate (HCC)    Memory difficulties    Positive TB test    RA (rheumatoid arthritis) ([place])    SLE (systemic lupus erythematosus) ([place])    Tinea versicolor      Family History  Problem Relation Age of Onset   Heart disease Mother    Mental illness Mother        depression and anxiety   Congestive Heart Failure Mother    Rheum arthritis Mother    Arthritis Father    Hyperlipidemia Father    Other Father        prostate issues   Drug abuse Brother    Mental illness Brother    Bipolar disorder Brother      Social History   Socioeconomic History   Marital status: Single    Spouse name: Not on file   Number of children: 1   Years of education: trade   Highest education  level: Not on file  Occupational History   Occupation: Facilities manager: [employer]  Tobacco Use   Smoking status: Former Smoker    Types: Cigarettes    Quit date: 06/09/2011    Years since quitting: 8.6   Smokeless tobacco: Never Used   Tobacco comment: approx 1 pack per week off and on   Vaping Use   Vaping Use: Never used  Substance and Sexual Activity   Alcohol use: Yes    Alcohol/week: 2.0 standard drinks    Types: 2 Glasses of wine per week    Comment: social   Drug use: No   Sexual activity: Yes    Partners: Male    Birth control/protection: Condom  Other Topics Concern   Not on file  Social History Narrative   Originally from [place]--grew up in [place], lived in [place] to [place] in 2005   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 Daughter born 49   Social Determinants of Health   Financial Resource Strain:    Difficulty of Paying Living Expenses:   Food Insecurity:    Worried About Running Out of  Food in the Last Year:    Arboriculturist in the Last Year:   Transportation Needs:    Film/video editor (Medical):    Lack of Transportation (Non-Medical):   Physical Activity:    Days of Exercise per Week:    Minutes of Exercise per Session:   Stress:    Feeling of Stress :   Social Connections:    Frequency of Communication with Friends and Family:    Frequency of Social Gatherings with Friends and Family:    Attends Religious Services:    Active Member of Clubs or Organizations:    Attends Music therapist:    Marital Status:   Intimate Partner Violence:    Fear of Current or Ex-Partner:    Emotionally Abused:    Physically Abused:    Sexually Abused:     From [place], has lived in [place], [place], [place] She is an Therapist, sports  Allergies  Allergen Reactions   Peanuts [Peanut Oil] Anaphylaxis   Soy Allergy Anaphylaxis   Penicillins     Yeast infection   Sulfa  Antibiotics Rash     Outpatient Medications Prior to Visit  Medication Sig Dispense Refill   amLODipine (NORVASC) 10 MG tablet Take 1 tablet by mouth daily.     busPIRone (BUSPAR) 7.5 MG tablet Take 1 tablet by mouth 2 (two) times daily as needed for anxiety.     EPINEPHrine 0.3 mg/0.3 mL IJ SOAJ injection Inject 0.3 mLs into the muscle once as needed for anaphylaxis.     gabapentin (NEURONTIN) 300 MG capsule Take 1 capsule (300 mg total) by mouth at bedtime. 90 capsule 1   leflunomide (ARAVA) 20 MG tablet Take 1 tablet by mouth daily.     omeprazole (PRILOSEC) 20 MG capsule Take 2 capsules by mouth 2 (two) times daily.     predniSONE (DELTASONE) 10 MG tablet Take 10 mg by mouth daily as needed.     Adalimumab (HUMIRA PEN) 40 MG/0.8ML PNKT Inject 40 mg into the skin once a week. (Patient not taking: Reported on 02/17/2020)     hydroxychloroquine (PLAQUENIL) 200 MG tablet Take 1 tablet by mouth 2 (two) times daily. (Patient not taking: Reported on 02/17/2020)     montelukast (SINGULAIR) 10 MG tablet Take 1 tablet (10 mg total) by mouth at bedtime. (Patient not taking: Reported on 02/17/2020) 90 tablet 1   traZODone (DESYREL) 100 MG tablet Take 100 mg by mouth at bedtime.     No facility-administered medications prior to visit.        Objective:   Physical Exam Vitals:   02/17/20 0919  BP: 130/80  Pulse: 90  Temp: 97.9 F (36.6 C)  TempSrc: Oral  SpO2: 97%  Weight: 191 lb (86.6 kg)  Height: 5' 4.5" (1.638 m)   Gen: Pleasant, well-nourished, in no distress,  normal affect  ENT: No lesions,  mouth clear,  oropharynx clear, no postnasal drip  Neck: No JVD, no stridor  Lungs: No use of accessory muscles, no crackles or wheezing on normal respiration, no wheeze on forced expiration  Cardiovascular: RRR, heart sounds normal, no murmur or gallops, no peripheral edema  Musculoskeletal: some PIP joint swelling.   Neuro: alert, awake, non focal  Skin: Warm, no lesions or  rash       Assessment & Plan:  Dyspnea Progressive dyspnea with exertion.  She has had a reassuring cardiac evaluation in [facility].  She has heard wheeze associated with shortness of breath,  has also had some right-sided pleuritic pain.  Consider obstructive disease.  Certainly at risk for interstitial changes due to her RA/SLE, immunomodulatory medications.  Needs PFT, chest x-ray now.  Depending on the results we may decide to pursue high-resolution CT scan of the chest.  Chest x-ray today We will perform pulmonary function testing at your next office visit and then review the results together Follow with Dr. Lamonte Sakai next available with full pulmonary function testing on the same day.  Baltazar Apo, MD, PhD 02/17/2020, 9:57 AM [facility] [phone] or if no answer [phone]

## 2020-02-20 MED FILL — GABAPENTIN 300 MG CAPSULE: 300 | 30 days supply | Qty: 30 | Fill #0

## 2020-02-29 MED FILL — predniSONE 10 MG TABS: 10 | 30 days supply | Qty: 60 | Fill #0

## 2020-03-01 MED FILL — traZODone HCL 100 MG TABS: 100 | 30 days supply | Qty: 30 | Fill #0

## 2020-03-21 ENCOUNTER — Other Ambulatory Visit (HOSPITAL_COMMUNITY): Payer: Self-pay | Admitting: Physician Assistant

## 2020-03-21 MED FILL — GABAPENTIN 300 MG CAPSULE: 300 | 30 days supply | Qty: 90 | Fill #0

## 2020-03-26 MED FILL — valACYclovir HCL 1 GM TABS: 1 | 30 days supply | Qty: 30 | Fill #0

## 2020-03-28 MED FILL — METHOTREXATE SODIUM 2.5 MG: 2.5 | 84 days supply | Qty: 36 | Fill #0

## 2020-03-30 ENCOUNTER — Other Ambulatory Visit (HOSPITAL_COMMUNITY): Payer: Self-pay | Admitting: Physician Assistant

## 2020-03-30 MED FILL — traZODone HCL 100 MG TABS: 100 | 30 days supply | Qty: 30 | Fill #0

## 2020-04-02 ENCOUNTER — Other Ambulatory Visit (HOSPITAL_COMMUNITY)
Admission: RE | Admit: 2020-04-02 | Discharge: 2020-04-02 | Disposition: A | Discharge status: Home / Self Care | Payer: No Typology Code available for payment source | Source: Ambulatory Visit | Attending: Emergency Medicine | Admitting: Emergency Medicine

## 2020-04-02 DIAGNOSIS — Z01812 Encounter for preprocedural laboratory examination: Secondary | ICD-10-CM | POA: Diagnosis not present

## 2020-04-02 DIAGNOSIS — Z20822 Contact with and (suspected) exposure to covid-19: Secondary | ICD-10-CM | POA: Diagnosis not present

## 2020-04-02 LAB — SARS CORONAVIRUS 2 (TAT 6-24 HRS): SARS Coronavirus 2: NEGATIVE

## 2020-04-05 ENCOUNTER — Ambulatory Visit (INDEPENDENT_AMBULATORY_CARE_PROVIDER_SITE_OTHER): Payer: No Typology Code available for payment source | Admitting: Emergency Medicine

## 2020-04-05 ENCOUNTER — Other Ambulatory Visit: Payer: Self-pay

## 2020-04-05 ENCOUNTER — Ambulatory Visit: Payer: Self-pay | Admitting: Emergency Medicine

## 2020-04-05 DIAGNOSIS — R0602 Shortness of breath: Secondary | ICD-10-CM

## 2020-04-05 NOTE — Progress Notes (Signed)
PFT done today. 

## 2020-04-17 ENCOUNTER — Encounter: Payer: Self-pay | Admitting: Adult Health

## 2020-04-17 ENCOUNTER — Ambulatory Visit (INDEPENDENT_AMBULATORY_CARE_PROVIDER_SITE_OTHER): Payer: No Typology Code available for payment source | Admitting: Adult Health

## 2020-04-17 ENCOUNTER — Other Ambulatory Visit: Payer: Self-pay

## 2020-04-17 DIAGNOSIS — J4530 Mild persistent asthma, uncomplicated: Secondary | ICD-10-CM

## 2020-04-17 DIAGNOSIS — M069 Rheumatoid arthritis, unspecified: Secondary | ICD-10-CM

## 2020-04-17 DIAGNOSIS — R0602 Shortness of breath: Secondary | ICD-10-CM | POA: Diagnosis not present

## 2020-04-17 DIAGNOSIS — J45909 Unspecified asthma, uncomplicated: Secondary | ICD-10-CM | POA: Insufficient documentation

## 2020-04-17 LAB — PULMONARY FUNCTION TEST
DL/VA % pred: 73 %
DL/VA: 3.12 ml/min/mmHg/L
DLCO cor % pred: 80 %
DLCO cor: 17.54 ml/min/mmHg
DLCO unc % pred: 80 %
DLCO unc: 17.54 ml/min/mmHg
FEF 25-75 Post: 1.62 L/sec
FEF 25-75 Pre: 0.98 L/sec
FEF2575-%Change-Post: 64 %
FEF2575-%Pred-Post: 65 %
FEF2575-%Pred-Pre: 39 %
FEV1-%Change-Post: 15 %
FEV1-%Pred-Post: 98 %
FEV1-%Pred-Pre: 84 %
FEV1-Post: 2.35 L
FEV1-Pre: 2.03 L
FEV1FVC-%Change-Post: 9 %
FEV1FVC-%Pred-Pre: 73 %
FEV6-%Change-Post: 7 %
FEV6-%Pred-Post: 121 %
FEV6-%Pred-Pre: 113 %
FEV6-Post: 3.53 L
FEV6-Pre: 3.29 L
FEV6FVC-%Change-Post: 1 %
FEV6FVC-%Pred-Post: 101 %
FEV6FVC-%Pred-Pre: 100 %
FVC-%Change-Post: 5 %
FVC-%Pred-Post: 119 %
FVC-%Pred-Pre: 112 %
FVC-Post: 3.58 L
FVC-Pre: 3.38 L
Post FEV1/FVC ratio: 66 %
Post FEV6/FVC ratio: 99 %
Pre FEV1/FVC ratio: 60 %
Pre FEV6/FVC Ratio: 97 %
RV % pred: 123 %
RV: 2.29 L
TLC % pred: 115 %
TLC: 6.01 L

## 2020-04-17 MED ORDER — ALBUTEROL SULFATE HFA 108 (90 BASE) MCG/ACT IN AERS: 1.0000 | INHALATION_SPRAY | Freq: Four times a day (QID) | RESPIRATORY_TRACT | 2 refills | Status: AC | PRN

## 2020-04-17 MED ORDER — BUDESONIDE-FORMOTEROL FUMARATE 80-4.5 MCG/ACT IN AERO: 2.0000 | INHALATION_SPRAY | Freq: Two times a day (BID) | RESPIRATORY_TRACT | 5 refills | Status: DC

## 2020-04-17 MED FILL — ALBUTEROL SULFATE HFA 108 (: 108 (90 BAS | 25 days supply | Qty: 9 | Fill #0

## 2020-04-17 MED FILL — SYMBICORT 80-4.5 MCG INH: 80-4.5 | 30 days supply | Qty: 10 | Fill #0

## 2020-04-17 NOTE — Assessment & Plan Note (Signed)
Dyspnea suspect is multifactorial with associated deconditioning due to severe joint pain from rheumatoid arthritis and lupus.  Pulmonary function testing does show mild airflow obstruction may have a component of asthma.  We will begin ICS/LABA combo to see if this helps.  Although she has no significant cough or wheezing.  She has been seen by cardiology.  O2 saturations are good on room air.  At 98%.  She has no chest pain hemoptysis or calf pain. Previous chest x-ray showed no acute process.  However patient is at high risk with uncontrolled underlying autoimmune disorder.  Will check high-resolution CT chest to rule out possible underlying interstitial lung process. Control for possible triggers including chronic rhinitis and GERD.   PLAN  Patient Instructions  Begin Symbicort 80 2 puffs Twice daily  , rinse after use.  Take Allegra 180mg  daily  Take Flonase nasal daily  Continue on Prilosec 20mg  Twice daily   GERD diet .  Albuterol inhaler 1-2 puffs every 6hrs as needed.  HRCT Chest in 3 weeks and will discuss results at return office visit .  Call Rheumatology to discuss prednisone and referral if needed.  Follow up in 4 weeks with Dr. Lamonte Sakai  Or Suleyma Wafer NP and As needed   Please contact office for sooner follow up if symptoms do not improve or worsen or seek emergency care

## 2020-04-17 NOTE — Progress Notes (Signed)
@Patient  ID: Bonnie Farmer, female    DOB: 06-16-1966, 54 y.o.   MRN: [id number]  Chief Complaint  Patient presents with  . Follow-up    Dyspnea     Referring provider: Johna Roles, [place]  HPI: 54 year old female former smoker (minimal smoking history with 1 pack year) seen for pulmonary consult February 17, 2020 for shortness of breath. Medical history significant for SLE, rheumatoid arthritis.  History of latent TB (never treated) Previously on Plaquenil, Humira, Arava, Orencia , MTX, Prednisone.   TEST/EVENTS :   04/17/2020 Follow up : Dyspnea  Patient returns for a 20-month follow-up.  Patient was seen last visit for a pulmonary consult for progressive shortness of breath over the last year. Over last year having dyspnea , worsening , with decreased activity tolerance .  Has minimum cough.  Was initially seen by cardiology. Reports cardiology  workup was good, with normal echo .  Says she has allergies symptoms, running nose , dry cough , post nasal drip .  Does have GERD and is on Prilosec twice daily says it controls fairly well . Has minimum smoking history.  No formal diagnosis of asthma or COPD   Chest xray last ov showed clear lungs.  Pulmonary function testing today showed mild airflow obstruction with reversibility.  Significant mid flow obstruction and reversibility. Has RA and SLE followed by Rheumatology, Dr. Trudie Reed and Marella Chimes PA . Initially dx ~2007 with SLE and RA 2010  Says previously on MTX, Prednisone , Humira, Orencia, Plaquenil and Arava but says either she could not tolerate or they stopped working .  Has severe joint pain, stiffness, decreased activity tolerance. Diffuse pain.  Takes NSAIDs. Weaned off steroids 2 months ago. Prednisone works for pain but did not want to be on chronic steroids.  Since coming off of prednisone she says that she is having significant difficulty she can barely lift her arms or use her hands has difficulty walking and  severe pain.  Uses ibuprofen on a consistent basis.  She does work full-time as a Marine scientist but says she has great difficulty trying to complete her work requirements.  She says she had good control of her lupus and rheumatoid arthritis up until about a year ago and none of the treatments have worked.  She is very upset and frustrated with this she is somewhat tearful during today's visit. Social history she has minimum smoking history.  No birds, pets, unusual hobbies.  No hot tub or basement.  Allergies  Allergen Reactions  . Peanuts [Peanut Oil] Anaphylaxis  . Soy Allergy Anaphylaxis  . Penicillins     Yeast infection  . Sulfa Antibiotics Rash    Immunization History  Administered Date(s) Administered  . Influenza,inj,Quad PF,6+ Mos 06/13/2016, 05/05/2018  . Td 12/28/2009  . Tdap 06/13/2016    Past Medical History:  Diagnosis Date  . Allergy    year round, also food & drug allergies  . Anxiety   . Chronic constipation   . Degenerative cervical disc   . GERD without esophagitis   . Insomnia   . Lupus ([place])   . MDD (major depressive disorder), recurrent episode, moderate ([place])   . Memory difficulties   . Positive TB test   . RA (rheumatoid arthritis) ([place])   . SLE (systemic lupus erythematosus) ([place])   . Tinea versicolor     Tobacco History: Social History   Tobacco Use  Smoking Status Former Smoker  . Types: Cigarettes  . Quit date:  06/09/2011  . Years since quitting: 8.8  Smokeless Tobacco Never Used  Tobacco Comment   approx 1 pack per week off and on    Counseling given: Not Answered Comment: approx 1 pack per week off and on    Outpatient Medications Prior to Visit  Medication Sig Dispense Refill  . amLODipine (NORVASC) 10 MG tablet Take 1 tablet by mouth daily.    . busPIRone (BUSPAR) 7.5 MG tablet Take 1 tablet by mouth 2 (two) times daily as needed for anxiety.    Marland Kitchen EPINEPHrine 0.3 mg/0.3 mL IJ SOAJ injection Inject 0.3 mLs into the muscle once as needed  for anaphylaxis.    Marland Kitchen gabapentin (NEURONTIN) 300 MG capsule Take 1 capsule (300 mg total) by mouth at bedtime. 90 capsule 1  . omeprazole (PRILOSEC) 20 MG capsule Take 2 capsules by mouth 2 (two) times daily.    . traZODone (DESYREL) 100 MG tablet Take 100 mg by mouth at bedtime.    . methotrexate 2.5 MG tablet SMARTSIG:3 Tablet(s) By Mouth Once a Week (Patient not taking: Reported on 04/17/2020)    . predniSONE (DELTASONE) 10 MG tablet Take 10 mg by mouth daily as needed. (Patient not taking: Reported on 04/17/2020)    . Adalimumab (HUMIRA PEN) 40 MG/0.8ML PNKT Inject 40 mg into the skin once a week. (Patient not taking: Reported on 02/17/2020)    . hydroxychloroquine (PLAQUENIL) 200 MG tablet Take 1 tablet by mouth 2 (two) times daily. (Patient not taking: Reported on 02/17/2020)    . leflunomide (ARAVA) 20 MG tablet Take 1 tablet by mouth daily.    . montelukast (SINGULAIR) 10 MG tablet Take 1 tablet (10 mg total) by mouth at bedtime. (Patient not taking: Reported on 02/17/2020) 90 tablet 1   No facility-administered medications prior to visit.     Review of Systems:   Constitutional:   No  weight loss, night sweats,  Fevers, chills, + fatigue, or  lassitude.  HEENT:   No headaches,  Difficulty swallowing,  Tooth/dental problems, or  Sore throat,                No sneezing, itching, ear ache, nasal congestion, post nasal drip,   CV:  No chest pain,  Orthopnea, PND, swelling in lower extremities, anasarca, dizziness, palpitations, syncope.   GI  No heartburn, indigestion, abdominal pain, nausea, vomiting, diarrhea, change in bowel habits, loss of appetite, bloody stools.   Resp:   No chest wall deformity  Skin: no rash or lesions.  GU: no dysuria, change in color of urine, no urgency or frequency.  No flank pain, no hematuria   MS: Diffuse joint pain   Physical Exam  BP 130/70 (BP Location: Left Arm, Cuff Size: Normal)   Pulse 84   Temp 97.6 F (36.4 C) (Oral)   Ht 5\' 5"  (1.651  m)   Wt 193 lb (87.5 kg)   SpO2 98%   BMI 32.12 kg/m   GEN: A/Ox3; pleasant , NAD, well nourished    HEENT:  [place]/AT,  EACs-clear, TMs-wnl, NOSE-clear, THROAT-clear, no lesions, no postnasal drip or exudate noted.   NECK:  Supple w/ fair ROM; no JVD; normal carotid impulses w/o bruits; no thyromegaly or nodules palpated; no lymphadenopathy.    RESP  Clear  P & A; w/o, wheezes/ rales/ or rhonchi. no accessory muscle use, no dullness to percussion  CARD:  RRR, no m/r/g, no peripheral edema, pulses intact, no cyanosis or clubbing.  GI:   Soft &  nt; nml bowel sounds; no organomegaly or masses detected.   Musco: Warm bil,  Decreased range of motion of hands arms and lower extremities.  Notable swelling in the hands  Neuro: alert, no focal deficits noted.    Skin: Warm, no lesions or rashes    Lab Results:   BMET  BNP No results found for: BNP  ProBNP No results found for: PROBNP  Imaging: No results found.    PFT Results Latest Ref Rng & Units 04/05/2020  FVC-Pre L 3.38  FVC-Predicted Pre % 112  FVC-Post L 3.58  FVC-Predicted Post % 119  Pre FEV1/FVC % % 60  Post FEV1/FCV % % 66  FEV1-Pre L 2.03  FEV1-Predicted Pre % 84  FEV1-Post L 2.35  DLCO uncorrected ml/min/mmHg 17.54  DLCO UNC% % 80  DLCO corrected ml/min/mmHg 17.54  DLCO COR %Predicted % 80  DLVA Predicted % 73  TLC L 6.01  TLC % Predicted % 115  RV % Predicted % 123    No results found for: NITRICOXIDE      Assessment & Plan:   Dyspnea Dyspnea suspect is multifactorial with associated deconditioning due to severe joint pain from rheumatoid arthritis and lupus.  Pulmonary function testing does show mild airflow obstruction may have a component of asthma.  We will begin ICS/LABA combo to see if this helps.  Although she has no significant cough or wheezing.  She has been seen by cardiology.  O2 saturations are good on room air.  At 98%.  She has no chest pain hemoptysis or calf pain. Previous  chest x-ray showed no acute process.  However patient is at high risk with uncontrolled underlying autoimmune disorder.  Will check high-resolution CT chest to rule out possible underlying interstitial lung process. Control for possible triggers including chronic rhinitis and GERD.   PLAN  Patient Instructions  Begin Symbicort 80 2 puffs Twice daily  , rinse after use.  Take Allegra 180mg  daily  Take Flonase nasal daily  Continue on Prilosec 20mg  Twice daily   GERD diet .  Albuterol inhaler 1-2 puffs every 6hrs as needed.  HRCT Chest in 3 weeks and will discuss results at return office visit .  Call Rheumatology to discuss prednisone and referral if needed.  Follow up in 4 weeks with Dr. Lamonte Sakai  Or Roshard Rezabek NP and As needed   Please contact office for sooner follow up if symptoms do not improve or worsen or seek emergency care       Asthma Mild airflow obstruction and reversibility on pulmonary function testing consistent with a possible underlying asthma component.  She has minimal smoking history. Empiric trial of ICS/LABA combo. Follow-up visit in 4 to 6 weeks for follow-up  Rheumatoid arthritis [facility]) Patient has significant severe diffuse joint pain mobility and decreased activity tolerance.  She is quite tearful throughout the exam today.  Have asked her to follow back up with rheumatology to discuss possible steroids.  Also concerned that uncontrolled rheumatoid arthritis/SLE places her at risk for other organ damage. Also concerned the patient has been on steroids on and off over the last 10+ years.  Concerned that symptoms may be increased now that she is completely off the steroids for the last 2 months.  May need to have further evaluation for possible adrenal insufficiency.  Plan Patient Instructions  Begin Symbicort 80 2 puffs Twice daily  , rinse after use.  Take Allegra 180mg  daily  Take Flonase nasal daily  Continue on Prilosec 20mg  Twice  daily   GERD diet .  Albuterol  inhaler 1-2 puffs every 6hrs as needed.  HRCT Chest in 3 weeks and will discuss results at return office visit .  Call Rheumatology to discuss prednisone and referral if needed.  Follow up in 4 weeks with Dr. Lamonte Sakai  Or Jerrye Seebeck NP and As needed   Please contact office for sooner follow up if symptoms do not improve or worsen or seek emergency care          Rexene Edison, NP 04/17/2020

## 2020-04-17 NOTE — Assessment & Plan Note (Signed)
Mild airflow obstruction and reversibility on pulmonary function testing consistent with a possible underlying asthma component.  She has minimal smoking history. Empiric trial of ICS/LABA combo. Follow-up visit in 4 to 6 weeks for follow-up

## 2020-04-17 NOTE — Assessment & Plan Note (Signed)
Patient has significant severe diffuse joint pain mobility and decreased activity tolerance.  She is quite tearful throughout the exam today.  Have asked her to follow back up with rheumatology to discuss possible steroids.  Also concerned that uncontrolled rheumatoid arthritis/SLE places her at risk for other organ damage. Also concerned the patient has been on steroids on and off over the last 10+ years.  Concerned that symptoms may be increased now that she is completely off the steroids for the last 2 months.  May need to have further evaluation for possible adrenal insufficiency.  Plan Patient Instructions  Begin Symbicort 80 2 puffs Twice daily  , rinse after use.  Take Allegra 180mg  daily  Take Flonase nasal daily  Continue on Prilosec 20mg  Twice daily   GERD diet .  Albuterol inhaler 1-2 puffs every 6hrs as needed.  HRCT Chest in 3 weeks and will discuss results at return office visit .  Call Rheumatology to discuss prednisone and referral if needed.  Follow up in 4 weeks with Dr. Lamonte Sakai  Or Lamont Glasscock NP and As needed   Please contact office for sooner follow up if symptoms do not improve or worsen or seek emergency care

## 2020-04-17 NOTE — Patient Instructions (Addendum)
Begin Symbicort 80 2 puffs Twice daily  , rinse after use.  Take Allegra 180mg  daily  Take Flonase nasal daily  Continue on Prilosec 20mg  Twice daily   GERD diet .  Albuterol inhaler 1-2 puffs every 6hrs as needed.  HRCT Chest in 3 weeks and will discuss results at return office visit .  Call Rheumatology to discuss prednisone and referral if needed.  Follow up in 4 weeks with Dr. Lamonte Sakai  Or Cooper Moroney NP and As needed   Please contact office for sooner follow up if symptoms do not improve or worsen or seek emergency care

## 2020-04-27 MED FILL — predniSONE 10 MG TABS: 10 | 30 days supply | Qty: 60 | Fill #1

## 2020-05-03 MED FILL — traZODone HCL 100 MG TABS: 100 | 30 days supply | Qty: 30 | Fill #1

## 2020-05-08 ENCOUNTER — Ambulatory Visit (HOSPITAL_COMMUNITY): Admission: RE | Admit: 2020-05-08 | Payer: No Typology Code available for payment source | Source: Ambulatory Visit

## 2020-05-14 MED FILL — ALBUTEROL SULFATE HFA 108 (: 108 (90 BAS | 25 days supply | Qty: 9 | Fill #1

## 2020-05-16 ENCOUNTER — Ambulatory Visit: Payer: No Typology Code available for payment source | Admitting: Adult Health

## 2020-05-22 ENCOUNTER — Ambulatory Visit
Admission: RE | Admit: 2020-05-22 | Discharge: 2020-05-22 | Disposition: A | Discharge status: Home / Self Care | Payer: No Typology Code available for payment source | Source: Ambulatory Visit | Attending: Physician Assistant | Admitting: Physician Assistant

## 2020-05-22 ENCOUNTER — Other Ambulatory Visit: Payer: Self-pay | Admitting: Physician Assistant

## 2020-05-22 DIAGNOSIS — M0589 Other rheumatoid arthritis with rheumatoid factor of multiple sites: Secondary | ICD-10-CM

## 2020-05-22 DIAGNOSIS — I10 Essential (primary) hypertension: Secondary | ICD-10-CM

## 2020-05-22 DIAGNOSIS — J4520 Mild intermittent asthma, uncomplicated: Secondary | ICD-10-CM

## 2020-05-25 ENCOUNTER — Other Ambulatory Visit (HOSPITAL_COMMUNITY): Payer: Self-pay | Admitting: Physician Assistant

## 2020-05-25 MED FILL — predniSONE 10 MG TABS: 10 | 30 days supply | Qty: 30 | Fill #0

## 2020-05-28 ENCOUNTER — Other Ambulatory Visit (HOSPITAL_COMMUNITY): Payer: Self-pay | Admitting: Optometry

## 2020-05-28 MED FILL — METHYLPREDNISOLONE 4 MG TAB: 4 | 6 days supply | Qty: 21 | Fill #0

## 2020-05-28 MED FILL — DIFLUPREDNATE 0.05 % EMUL: 0.05 | 13 days supply | Qty: 5 | Fill #0

## 2020-05-28 MED FILL — NEO/POLY/DEXAMET EYE OINT: 3.5-10000-0 | 10 days supply | Qty: 4 | Fill #0

## 2020-05-29 MED FILL — traZODone HCL 100 MG TABS: 100 | 30 days supply | Qty: 30 | Fill #2

## 2020-05-31 ENCOUNTER — Telehealth: Payer: Self-pay

## 2020-05-31 NOTE — Telephone Encounter (Signed)
New Patient referral for 06/22/20 for surgical clearance was cancel by [facility] @ [facility] - received fax request on 05-30-28.  Per note please cancel this referral, we have declined her surgical clearence so this is no longer needed per Sharyon Cable

## 2020-06-13 ENCOUNTER — Ambulatory Visit: Payer: No Typology Code available for payment source | Attending: Physician Assistant | Admitting: Occupational Therapy

## 2020-06-13 ENCOUNTER — Other Ambulatory Visit: Payer: Self-pay

## 2020-06-13 DIAGNOSIS — M6281 Muscle weakness (generalized): Secondary | ICD-10-CM | POA: Insufficient documentation

## 2020-06-13 DIAGNOSIS — R278 Other lack of coordination: Secondary | ICD-10-CM | POA: Diagnosis present

## 2020-06-13 DIAGNOSIS — M79642 Pain in left hand: Secondary | ICD-10-CM | POA: Diagnosis not present

## 2020-06-13 DIAGNOSIS — M79641 Pain in right hand: Secondary | ICD-10-CM | POA: Diagnosis present

## 2020-06-13 NOTE — Therapy (Signed)
[facility] [address] [place], [place], [address] Phone: [phone]   Fax:  [phone]  Occupational Therapy Evaluation  Patient Details  Name: Bonnie Farmer MRN: [id number] Date of Birth: [birth year] Referring Provider (OT): Dr. Thayer Ohm   Encounter Date: 06/13/2020   OT End of Session - 06/13/20 1037    Visit Number 1    Number of Visits 5    Date for OT Re-Evaluation 07/14/20    Authorization Type [facility] plan    OT Start Time 0930    OT Stop Time 1030    OT Time Calculation (min) 60 min    Activity Tolerance Patient tolerated treatment well    Behavior During Therapy [facility] for tasks assessed/performed           Past Medical History:  Diagnosis Date  . Allergy    year round, also food & drug allergies  . Anxiety   . Chronic constipation   . Degenerative cervical disc   . GERD without esophagitis   . Insomnia   . Lupus ([place])   . MDD (major depressive disorder), recurrent episode, moderate ([place])   . Memory difficulties   . Positive TB test   . RA (rheumatoid arthritis) ([place])   . SLE (systemic lupus erythematosus) ([place])   . Tinea versicolor     Past Surgical History:  Procedure Laterality Date  . RENAL BIOPSY Left 5 years prior   for lupus  . TONSILLECTOMY    . UPPER GASTROINTESTINAL ENDOSCOPY      There were no vitals filed for this visit.   Subjective Assessment - 06/13/20 0938    Pertinent History Systemic lupus, RA (Recent flare up 01/2020) but diagnosed 10+ years, asthma    Currently in Pain? Yes   Pt also reports back pain 8/10 Rt lower   Pain Score 5     Pain Location Hand   Lt foot/ankle   Pain Orientation Left    Pain Descriptors / Indicators Sharp;Aching    Pain Type Chronic pain    Pain Onset More than a month ago    Pain Frequency Constant              [facility] OT Assessment - 06/13/20 0001      Assessment   Medical Diagnosis Systemic lupus (SLE), RA     Referring Provider (OT) Dr. Thayer Ohm    Onset Date/Surgical Date --   10+ years (latest flare up 6/21)   Hand Dominance Right    Prior Therapy none for SLE/RA      Precautions   Precaution Comments Avoid  extreme sunlight      Balance Screen   Has the patient fallen in the past 6 months No      Home  Environment   Bathroom Shower/Tub Tub/Shower unit;Curtain    Additional Comments Pt lives in townhome with 1/2 bath on first floor, bedroom and full bath on 2nd floor    Lives With Spouse;Daughter   39 y.o. daughter      Prior Function   Level of Independence Independent    Vocation Full time employment    Engineer, mining      ADL   Eating/Feeding Modified independent   hurts w/ cutting food Lt wrist   Grooming Modified independent   gets hair done at salon   Upper Body Bathing Modified independent    Lower Body Bathing Modified independent    Upper Body Dressing Increased time  Lower Body Dressing Increased time   difficulty w/ donning socks and pants over feet   Toilet Transfer Modified independent    Toileting -  Product/process development scientist Modified independent      IADL   Shopping Shops independently for small purchases    Light Housekeeping Does personal laundry completely;Performs light daily tasks such as dishwashing, bed making;Maintains house alone or with occasional assistance    Meal Prep Plans, prepares and serves adequate meals independently   pain with chopping, lifting pots/pans   Product/process development scientist Status Independent      Written Expression   Dominant Hand Right    Handwriting --   worse over the years     Vision - History   Baseline Vision Wears glasses all the time   but does not have for evaluation     Activity Tolerance   Activity Tolerance --   Pt fatigues quicker and has to sit more     Observation/Other Assessments   Observations Very pleasant female, pt also has  asthma, reports dropping things from Lt hand      Sensation   Additional Comments intermittent Lt hand ulnar side. Intact for light touch and localization      Coordination   9 Hole Peg Test Right;Left    Right 9 Hole Peg Test 22.84 sec    Left 9 Hole Peg Test 22.50 sec      Edema   Edema none currently in UE's but previously from flare up      ROM / Strength   AROM / PROM / Strength AROM;Strength      AROM   Overall AROM Comments BUE AROM WFL's but reports stiffness if laying down to long      Strength   Overall Strength Comments BUE MMT grossly 5/5 w/ shoulder flex and abduction, 4/5 with ER/IR      Hand Function   Right Hand Grip (lbs) 45.1 lbs    Left Hand Grip (lbs) 47.1 lbs                            OT Education - 06/13/20 1043    Education Details A/E recommendations (jar opener, medicine bottle opener, ergonomic knife, button hook, LE dressing A/E)    Person(s) Educated Patient    Methods Explanation;Handout    Comprehension Verbalized understanding               OT Long Term Goals - 06/13/20 1044      OT LONG TERM GOAL #1   Title Independent with HEP for UE's and bilateral grip strength    Time 4    Period Weeks    Status New      OT LONG TERM GOAL #2   Title Pt to verbalize understanding with potential A/E needs and task modifications to increase ease with ADLS    Time 4    Period Weeks    Status On-going      OT LONG TERM GOAL #3   Title Pt to verbalize understanding with energy conservation techniques and pain management strategies    Time 4    Period Weeks    Status New                 Plan - 06/13/20 1038    Clinical Impression Statement Pt is a very pleasant 53  y.o. female who presents for OPOT evaluation with diagnosis: SLE (Systemic lupus erythematosus) and RA. Pt reports 10+ years with diagnoses, but more recent flare up in June 2021 - pt reports this flare up is starting to subside but was having trouble with  ADLS (inlcuding buttoning pants, donning socks/compression hose, opening jars, cutting/chopping, etc), increased dropping from Lt hand, and overall increased pain in joints and fatigue. Pt would benefit from O.T. to address A/E needs, train in HEP and educate in energy conservation techniques and overall pain management    OT Occupational Profile and History Problem Focused Assessment - Including review of records relating to presenting problem    Occupational performance deficits (Please refer to evaluation for details): ADL's;IADL's;Work;Leisure    Body Structure / Function / Physical Skills ADL;Decreased knowledge of use of DME;Pain;UE functional use;Edema;Body mechanics;IADL;ROM;Flexibility;FMC;Endurance;Sensation;Strength    Rehab Potential Good    Clinical Decision Making Several treatment options, min-mod task modification necessary    Comorbidities Affecting Occupational Performance: May have comorbidities impacting occupational performance    Modification or Assistance to Complete Evaluation  No modification of tasks or assist necessary to complete eval    OT Frequency 1x / week    OT Duration 4 weeks   plus eval   OT Treatment/Interventions Self-care/ADL training;Moist Heat;Splinting;DME and/or AE instruction;Therapeutic activities;Therapeutic exercise;Coping strategies training;Passive range of motion;Neuromuscular education;Manual Therapy;Energy conservation;Patient/family education    Plan Pt to bring in compression socks and button up pants to practice with A/E, energy conservation techniques, inquire about MD visit and what she recommended re: pool use and hot tub    Recommended Other Services P.T. referral    Consulted and Agree with Plan of Care Patient           Patient will benefit from skilled therapeutic intervention in order to improve the following deficits and impairments:   Body Structure / Function / Physical Skills: ADL, Decreased knowledge of use of DME, Pain, UE  functional use, Edema, Body mechanics, IADL, ROM, Flexibility, FMC, Endurance, Sensation, Strength       Visit Diagnosis: Pain in left hand  Pain in right hand  Muscle weakness (generalized)  Other lack of coordination    Problem List Patient Active Problem List   Diagnosis Date Noted  . Asthma 04/17/2020  . Dyspnea 02/17/2020  . GAD (generalized anxiety disorder) 09/03/2017  . MDD (major depressive disorder), recurrent episode, moderate ([place]) 09/03/2017  . Chronic constipation   . Urinary urgency 11/17/2016  . Memory difficulty 11/17/2016  . Tinea versicolor 06/23/2016  . Keratosis pilaris 06/23/2016  . SLE (systemic lupus erythematosus) ([place]) 04/11/2016  . Rheumatoid arthritis ([place]) 04/11/2016  . Environmental and seasonal allergies 04/11/2016  . Decreased libido 04/11/2016    Carey Bullocks, OTR/L 06/13/2020, 10:49 AM  [facility] [address] [place], [place], [address] Phone: [phone]   Fax:  [phone]  Name: Bonnie Farmer MRN: [id number] Date of Birth: 02/27/1966

## 2020-06-14 ENCOUNTER — Other Ambulatory Visit (HOSPITAL_COMMUNITY): Payer: Self-pay | Admitting: Physician Assistant

## 2020-06-14 MED FILL — METHOTREXATE SODIUM 2.5 MG: 2.5 | 84 days supply | Qty: 60 | Fill #0

## 2020-06-14 MED FILL — IBUPROFEN 800 MG TABS: 800 | 90 days supply | Qty: 270 | Fill #0

## 2020-06-14 MED FILL — FAMOTIDINE 20 MG TABS: 20 | 90 days supply | Qty: 180 | Fill #0

## 2020-06-14 MED FILL — FOLIC ACID 1 MG TABS: 1 | 30 days supply | Qty: 60 | Fill #0

## 2020-06-15 ENCOUNTER — Other Ambulatory Visit (HOSPITAL_COMMUNITY): Payer: Self-pay | Admitting: Physician Assistant

## 2020-06-18 MED FILL — GABAPENTIN 300 MG CAPSULE: 300 | 30 days supply | Qty: 90 | Fill #1

## 2020-06-18 MED FILL — AMLODIPINE BESYLATE 10 MG T: 10 | 90 days supply | Qty: 90 | Fill #1

## 2020-06-18 MED FILL — traZODone HCL 100 MG TABS: 100 | 30 days supply | Qty: 60 | Fill #0

## 2020-06-22 ENCOUNTER — Ambulatory Visit: Payer: No Typology Code available for payment source | Admitting: Internal Medicine

## 2020-07-02 ENCOUNTER — Other Ambulatory Visit (HOSPITAL_COMMUNITY): Payer: Self-pay | Admitting: Optometry

## 2020-07-02 ENCOUNTER — Telehealth: Payer: Self-pay

## 2020-07-02 ENCOUNTER — Ambulatory Visit: Payer: No Typology Code available for payment source | Attending: Physician Assistant | Admitting: Occupational Therapy

## 2020-07-02 DIAGNOSIS — M79642 Pain in left hand: Secondary | ICD-10-CM | POA: Insufficient documentation

## 2020-07-02 DIAGNOSIS — M79641 Pain in right hand: Secondary | ICD-10-CM | POA: Insufficient documentation

## 2020-07-02 DIAGNOSIS — M6281 Muscle weakness (generalized): Secondary | ICD-10-CM | POA: Insufficient documentation

## 2020-07-02 MED FILL — DIFLUPREDNATE 0.05 % EMUL: 0.05 | 13 days supply | Qty: 5 | Fill #0

## 2020-07-02 NOTE — Telephone Encounter (Signed)
Called and spoke directly with Santiago Glad re: today's missed appointment. Pt was offered another appointment later today but unable to attend. Pt was informed of next appointment on 07/12/20 and greatly appreciated the call and reminder.

## 2020-07-03 ENCOUNTER — Other Ambulatory Visit (HOSPITAL_COMMUNITY): Payer: Self-pay | Admitting: Physician Assistant

## 2020-07-03 MED FILL — KETOCONAZOLE 2 % SHAM: 2 | 14 days supply | Qty: 120 | Fill #0

## 2020-07-12 ENCOUNTER — Ambulatory Visit: Payer: No Typology Code available for payment source | Admitting: Occupational Therapy

## 2020-07-16 ENCOUNTER — Ambulatory Visit: Payer: No Typology Code available for payment source | Admitting: Occupational Therapy

## 2020-07-16 ENCOUNTER — Other Ambulatory Visit: Payer: Self-pay

## 2020-07-16 DIAGNOSIS — M79641 Pain in right hand: Secondary | ICD-10-CM

## 2020-07-16 DIAGNOSIS — M6281 Muscle weakness (generalized): Secondary | ICD-10-CM | POA: Diagnosis present

## 2020-07-16 DIAGNOSIS — M79642 Pain in left hand: Secondary | ICD-10-CM | POA: Diagnosis not present

## 2020-07-16 NOTE — Therapy (Signed)
[facility] [address] [place], [place], [address] Phone: [phone]   Fax:  [phone]  Occupational Therapy Treatment  Patient Details  Name: Bonnie Farmer MRN: [id number] Date of Birth: [birth year] Referring Provider (OT): Dr. Thayer Ohm   Encounter Date: 07/16/2020   OT End of Session - 07/16/20 1152    Visit Number 2    Number of Visits 5    Date for OT Re-Evaluation 07/14/20    Authorization Type [facility] plan    OT Start Time 1100    OT Stop Time 1145    OT Time Calculation (min) 45 min    Activity Tolerance Patient tolerated treatment well    Behavior During Therapy [facility] for tasks assessed/performed           Past Medical History:  Diagnosis Date  . Allergy    year round, also food & drug allergies  . Anxiety   . Chronic constipation   . Degenerative cervical disc   . GERD without esophagitis   . Insomnia   . Lupus ([place])   . MDD (major depressive disorder), recurrent episode, moderate ([place])   . Memory difficulties   . Positive TB test   . RA (rheumatoid arthritis) ([place])   . SLE (systemic lupus erythematosus) ([place])   . Tinea versicolor     Past Surgical History:  Procedure Laterality Date  . RENAL BIOPSY Left 5 years prior   for lupus  . TONSILLECTOMY    . UPPER GASTROINTESTINAL ENDOSCOPY      There were no vitals filed for this visit.   Subjective Assessment - 07/16/20 1104    Subjective  My back hurts 8/10 but hope to begin P.T. for this    Pertinent History Systemic lupus, RA (Recent flare up 01/2020) but diagnosed 10+ years, asthma    Currently in Pain? Yes    Pain Score 3    2/10 both hands/wrists   Pain Location Hand   and wrist   Pain Orientation Right;Left    Pain Descriptors / Indicators Aching    Pain Type Chronic pain    Pain Onset More than a month ago    Pain Frequency Constant    Aggravating Factors  nothing    Pain Relieving Factors nothing            Further assessed A/E needs and explored different options - pt issued handouts on all potential A/E including compression sock donner, options for tying shoes, LE A/E (reacher, sock aide, LH shoe horn and sponge), jar openers, button hook, ergonomic knives, and also recommended electric can opener, large handled peeler, and hand chopper.  Pt practiced donning compression socks with donner w/ min cues - pt able to do I'ly with practice. Therapist also demo use of shoe buttons.                           OT Long Term Goals - 07/16/20 1153      OT LONG TERM GOAL #1   Title Independent with HEP for UE's and bilateral grip strength    Time 4    Period Weeks    Status New      OT LONG TERM GOAL #2   Title Pt to verbalize understanding with potential A/E needs and task modifications to increase ease with ADLS    Time 4    Period Weeks    Status Achieved  OT LONG TERM GOAL #3   Title Pt to verbalize understanding with energy conservation techniques and pain management strategies    Time 4    Period Weeks    Status New                 Plan - 07/16/20 1153    Clinical Impression Statement Pt met LTG #2. Pt more aware of A/E and what she may need to ease pain and difficulty w/ flare ups    OT Occupational Profile and History Problem Focused Assessment - Including review of records relating to presenting problem    Occupational performance deficits (Please refer to evaluation for details): ADL's;IADL's;Work;Leisure    Body Structure / Function / Physical Skills ADL;Decreased knowledge of use of DME;Pain;UE functional use;Edema;Body mechanics;IADL;ROM;Flexibility;FMC;Endurance;Sensation;Strength    Rehab Potential Good    Clinical Decision Making Several treatment options, min-mod task modification necessary    Comorbidities Affecting Occupational Performance: May have comorbidities impacting occupational performance    Modification or Assistance to  Complete Evaluation  No modification of tasks or assist necessary to complete eval    OT Frequency 1x / week    OT Duration 4 weeks   plus eval   OT Treatment/Interventions Self-care/ADL training;Moist Heat;Splinting;DME and/or AE instruction;Therapeutic activities;Therapeutic exercise;Coping strategies training;Passive range of motion;Neuromuscular education;Manual Therapy;Energy conservation;Patient/family education    Plan Address goals #1 and #3    Recommended Other Services P.T. referral    Consulted and Agree with Plan of Care Patient           Patient will benefit from skilled therapeutic intervention in order to improve the following deficits and impairments:   Body Structure / Function / Physical Skills: ADL, Decreased knowledge of use of DME, Pain, UE functional use, Edema, Body mechanics, IADL, ROM, Flexibility, FMC, Endurance, Sensation, Strength       Visit Diagnosis: Pain in left hand  Pain in right hand  Muscle weakness (generalized)    Problem List Patient Active Problem List   Diagnosis Date Noted  . Asthma 04/17/2020  . Dyspnea 02/17/2020  . GAD (generalized anxiety disorder) 09/03/2017  . MDD (major depressive disorder), recurrent episode, moderate ([place]) 09/03/2017  . Chronic constipation   . Urinary urgency 11/17/2016  . Memory difficulty 11/17/2016  . Tinea versicolor 06/23/2016  . Keratosis pilaris 06/23/2016  . SLE (systemic lupus erythematosus) ([place]) 04/11/2016  . Rheumatoid arthritis ([place]) 04/11/2016  . Environmental and seasonal allergies 04/11/2016  . Decreased libido 04/11/2016    Carey Bullocks , OTR/L 07/16/2020, 11:56 AM  [facility] [address] [place], [place], [address] Phone: [phone]   Fax:  [phone]  Name: Bonnie Farmer MRN: [id number] Date of Birth: 19-Nov-1965

## 2020-07-24 ENCOUNTER — Ambulatory Visit: Payer: No Typology Code available for payment source | Admitting: Occupational Therapy

## 2020-07-31 ENCOUNTER — Other Ambulatory Visit (HOSPITAL_COMMUNITY): Payer: Self-pay | Admitting: Optometry

## 2020-08-01 MED FILL — METHYLPREDNISOLONE 4 MG TBP: 4 | 6 days supply | Qty: 21 | Fill #0

## 2020-08-01 MED FILL — DIFLUPREDNATE 0.05 % EMUL: 0.05 | 14 days supply | Qty: 5 | Fill #0

## 2020-08-01 MED FILL — FLUCONAZOLE 150 MG TABS: 150 | 1 days supply | Qty: 1 | Fill #0

## 2020-08-01 MED FILL — NEO/POLY/DEXAMET EYE OINT: 3.5-10000-0 | 7 days supply | Qty: 4 | Fill #0

## 2020-08-01 MED FILL — CEPHALEXIN 500 MG CAPSULE: 500 | 10 days supply | Qty: 20 | Fill #0

## 2020-08-21 ENCOUNTER — Other Ambulatory Visit (HOSPITAL_COMMUNITY): Payer: Self-pay | Admitting: Physician Assistant

## 2020-08-21 MED FILL — azaTHIOprine 50 MG TABS: 50 | 30 days supply | Qty: 60 | Fill #0

## 2020-08-22 ENCOUNTER — Other Ambulatory Visit (HOSPITAL_COMMUNITY): Payer: Self-pay | Admitting: Optometry

## 2020-08-27 ENCOUNTER — Other Ambulatory Visit (HOSPITAL_COMMUNITY): Payer: Self-pay | Admitting: Optometry

## 2020-08-27 MED FILL — valACYclovir HCL 1 GM TABS: 1 | 7 days supply | Qty: 22 | Fill #0

## 2020-08-27 MED FILL — PREDNISOLONE AC 1% EYE DROP: 1 | 18 days supply | Qty: 10 | Fill #0

## 2020-08-29 MED FILL — SIMBRINZA 1-0.2 % SUSP: 1-0.2 | 30 days supply | Qty: 8 | Fill #0

## 2020-09-04 MED FILL — traZODone HCL 100 MG TABS: 100 | 30 days supply | Qty: 60 | Fill #1

## 2020-09-13 ENCOUNTER — Other Ambulatory Visit (HOSPITAL_COMMUNITY): Payer: Self-pay | Admitting: Physician Assistant

## 2020-09-13 MED FILL — GABAPENTIN 300 MG CAPSULE: 300 | 30 days supply | Qty: 90 | Fill #0

## 2020-09-18 ENCOUNTER — Other Ambulatory Visit (HOSPITAL_COMMUNITY): Payer: Self-pay | Admitting: Optometry

## 2020-09-18 MED FILL — KETOROLAC 0.5% OPHTH SOLN: 0.5 | 25 days supply | Qty: 5 | Fill #0

## 2020-09-18 MED FILL — DIFLUPREDNATE 0.05 % EMUL: 0.05 | 18 days supply | Qty: 5 | Fill #0

## 2020-09-25 MED FILL — AMLODIPINE BESYLATE 10 MG T: 10 | 90 days supply | Qty: 90 | Fill #2

## 2020-10-04 ENCOUNTER — Other Ambulatory Visit (HOSPITAL_COMMUNITY): Payer: Self-pay | Admitting: Optometry

## 2020-10-04 MED FILL — SIMBRINZA 1-0.2 % SUSP: 1-0.2 | 30 days supply | Qty: 8 | Fill #1

## 2020-10-04 MED FILL — TIMOLOL 0.5% EYE DROPS: 0.5 | 25 days supply | Qty: 5 | Fill #0

## 2020-10-12 ENCOUNTER — Other Ambulatory Visit (HOSPITAL_COMMUNITY): Payer: Self-pay | Admitting: Ophthalmology

## 2020-10-16 MED FILL — valACYclovir HCL 1 GM TABS: 1 | 7 days supply | Qty: 22 | Fill #1

## 2020-10-16 MED FILL — ATROPINE 1% EYE DROPS: 1 | 30 days supply | Qty: 2 | Fill #0

## 2020-10-30 ENCOUNTER — Other Ambulatory Visit (HOSPITAL_COMMUNITY): Payer: Self-pay | Admitting: Physician Assistant

## 2020-10-30 MED FILL — SERTRALINE HCL 50 MG TABLET: 50 | 30 days supply | Qty: 30 | Fill #0

## 2020-10-31 MED FILL — traZODone HCL 100 MG TABS: 100 | 30 days supply | Qty: 60 | Fill #2

## 2020-11-07 ENCOUNTER — Other Ambulatory Visit (HOSPITAL_COMMUNITY): Payer: Self-pay | Admitting: Physician Assistant

## 2020-11-12 MED FILL — ATROPINE 1% EYE DROPS: 1 | 30 days supply | Qty: 2 | Fill #1

## 2020-11-30 ENCOUNTER — Other Ambulatory Visit (HOSPITAL_COMMUNITY): Payer: Self-pay

## 2020-11-30 MED FILL — Timolol Maleate Ophth Soln 0.5%: OPHTHALMIC | 25 days supply | Qty: 5 | Fill #0 | Status: AC

## 2020-12-03 ENCOUNTER — Other Ambulatory Visit (HOSPITAL_COMMUNITY): Payer: Self-pay

## 2020-12-03 MED ORDER — SIMBRINZA 1-0.2 % OP SUSP
1.0000 [drp] | Freq: Two times a day (BID) | OPHTHALMIC | 99 refills | Status: DC
  Filled 2020-12-03: qty 8, 40d supply, fill #0
  Filled 2021-01-25: qty 8, 40d supply, fill #1

## 2020-12-03 MED FILL — Sertraline HCl Tab 50 MG: ORAL | 30 days supply | Qty: 30 | Fill #0 | Status: AC

## 2020-12-04 ENCOUNTER — Other Ambulatory Visit (HOSPITAL_COMMUNITY): Payer: Self-pay

## 2020-12-05 ENCOUNTER — Other Ambulatory Visit (HOSPITAL_COMMUNITY): Payer: Self-pay

## 2020-12-05 MED FILL — Valacyclovir HCl Tab 1 GM: ORAL | 7 days supply | Qty: 22 | Fill #0 | Status: AC

## 2020-12-11 ENCOUNTER — Other Ambulatory Visit (HOSPITAL_COMMUNITY): Payer: Self-pay

## 2020-12-11 MED FILL — Gabapentin Cap 300 MG: ORAL | 30 days supply | Qty: 90 | Fill #0 | Status: AC

## 2020-12-11 MED FILL — Prednisolone Acetate Ophth Susp 1%: OPHTHALMIC | 30 days supply | Qty: 5 | Fill #0 | Status: AC

## 2020-12-21 ENCOUNTER — Other Ambulatory Visit (HOSPITAL_COMMUNITY): Payer: Self-pay

## 2020-12-21 MED ORDER — SERTRALINE HCL 100 MG PO TABS
100.0000 mg | ORAL_TABLET | Freq: Every day | ORAL | 6 refills | Status: AC
  Filled 2020-12-21: qty 30, 30d supply, fill #0
  Filled 2021-01-24: qty 30, 30d supply, fill #1
  Filled 2021-02-21: qty 30, 30d supply, fill #2
  Filled 2021-03-26: qty 30, 30d supply, fill #3
  Filled 2021-05-08: qty 30, 30d supply, fill #4
  Filled 2021-06-16: qty 30, 30d supply, fill #5

## 2020-12-27 ENCOUNTER — Other Ambulatory Visit (HOSPITAL_COMMUNITY): Payer: Self-pay

## 2020-12-27 MED FILL — Trazodone HCl Tab 100 MG: ORAL | 30 days supply | Qty: 60 | Fill #0 | Status: AC

## 2021-01-01 ENCOUNTER — Other Ambulatory Visit (HOSPITAL_COMMUNITY): Payer: Self-pay

## 2021-01-01 MED ORDER — VALACYCLOVIR HCL 1 G PO TABS
1000.0000 mg | ORAL_TABLET | Freq: Every day | ORAL | 2 refills | Status: DC
  Filled 2021-01-01: qty 30, 30d supply, fill #0
  Filled 2021-02-01: qty 30, 30d supply, fill #1
  Filled 2021-03-03: qty 30, 30d supply, fill #2

## 2021-01-02 ENCOUNTER — Other Ambulatory Visit (HOSPITAL_COMMUNITY): Payer: Self-pay

## 2021-01-02 MED FILL — Amlodipine Besylate Tab 10 MG (Base Equivalent): ORAL | 90 days supply | Qty: 90 | Fill #0 | Status: AC

## 2021-01-24 ENCOUNTER — Other Ambulatory Visit (HOSPITAL_COMMUNITY): Payer: Self-pay

## 2021-01-24 MED FILL — Folic Acid Tab 1 MG: ORAL | 30 days supply | Qty: 60 | Fill #0 | Status: AC

## 2021-01-24 MED FILL — Methotrexate Sodium Tab 2.5 MG (Base Equiv): ORAL | 84 days supply | Qty: 60 | Fill #0 | Status: AC

## 2021-01-25 ENCOUNTER — Other Ambulatory Visit (HOSPITAL_COMMUNITY): Payer: Self-pay

## 2021-01-28 ENCOUNTER — Other Ambulatory Visit (HOSPITAL_COMMUNITY): Payer: Self-pay

## 2021-01-28 ENCOUNTER — Other Ambulatory Visit (HOSPITAL_BASED_OUTPATIENT_CLINIC_OR_DEPARTMENT_OTHER): Payer: Self-pay

## 2021-01-28 MED ORDER — METHOTREXATE SODIUM 2.5 MG PO TABS
12.5000 mg | ORAL_TABLET | ORAL | 2 refills | Status: DC
  Filled 2021-01-28: qty 60, 84d supply, fill #0

## 2021-01-29 ENCOUNTER — Other Ambulatory Visit (HOSPITAL_COMMUNITY): Payer: Self-pay

## 2021-01-30 ENCOUNTER — Other Ambulatory Visit (HOSPITAL_COMMUNITY): Payer: Self-pay

## 2021-01-30 MED ORDER — METHOTREXATE SODIUM 2.5 MG PO TABS
10.0000 mg | ORAL_TABLET | ORAL | 2 refills | Status: DC
  Filled 2021-01-30: qty 48, 84d supply, fill #0

## 2021-02-01 ENCOUNTER — Other Ambulatory Visit (HOSPITAL_COMMUNITY): Payer: Self-pay

## 2021-02-01 MED FILL — Timolol Maleate Ophth Soln 0.5%: OPHTHALMIC | 25 days supply | Qty: 5 | Fill #1 | Status: AC

## 2021-02-07 ENCOUNTER — Other Ambulatory Visit (HOSPITAL_COMMUNITY): Payer: Self-pay

## 2021-02-07 MED ORDER — HYDROCODONE-ACETAMINOPHEN 5-325 MG PO TABS
1.0000 | ORAL_TABLET | Freq: Two times a day (BID) | ORAL | 0 refills | Status: DC | PRN
  Filled 2021-02-07: qty 60, 30d supply, fill #0

## 2021-02-21 ENCOUNTER — Other Ambulatory Visit (HOSPITAL_COMMUNITY): Payer: Self-pay

## 2021-02-21 MED FILL — Prednisolone Acetate Ophth Susp 1%: OPHTHALMIC | 30 days supply | Qty: 5 | Fill #1 | Status: AC

## 2021-02-21 MED FILL — Prednisone Tab 10 MG: ORAL | 30 days supply | Qty: 30 | Fill #0 | Status: AC

## 2021-03-03 MED FILL — Trazodone HCl Tab 100 MG: ORAL | 30 days supply | Qty: 60 | Fill #1 | Status: AC

## 2021-03-04 ENCOUNTER — Other Ambulatory Visit (HOSPITAL_COMMUNITY): Payer: Self-pay

## 2021-03-11 ENCOUNTER — Other Ambulatory Visit (HOSPITAL_COMMUNITY): Payer: Self-pay

## 2021-03-11 MED ORDER — PREDNISONE 5 MG PO TABS
ORAL_TABLET | ORAL | 0 refills | Status: AC
  Filled 2021-03-11: qty 30, 15d supply, fill #0

## 2021-03-12 ENCOUNTER — Other Ambulatory Visit (HOSPITAL_COMMUNITY): Payer: Self-pay

## 2021-03-12 MED ORDER — FLUOROMETHOLONE 0.1 % OP SUSP
OPHTHALMIC | 2 refills | Status: DC
  Filled 2021-03-12: qty 5, 50d supply, fill #0

## 2021-03-14 ENCOUNTER — Other Ambulatory Visit (HOSPITAL_COMMUNITY): Payer: Self-pay

## 2021-03-19 ENCOUNTER — Other Ambulatory Visit (HOSPITAL_COMMUNITY): Payer: Self-pay

## 2021-03-19 MED ORDER — GABAPENTIN 300 MG PO CAPS
300.0000 mg | ORAL_CAPSULE | Freq: Three times a day (TID) | ORAL | 3 refills | Status: DC | PRN
  Filled 2021-03-19: qty 90, 30d supply, fill #0
  Filled 2021-06-16: qty 90, 30d supply, fill #1

## 2021-03-26 ENCOUNTER — Other Ambulatory Visit (HOSPITAL_COMMUNITY): Payer: Self-pay

## 2021-03-26 MED ORDER — PREDNISOLONE ACETATE 1 % OP SUSP
1.0000 [drp] | Freq: Two times a day (BID) | OPHTHALMIC | 99 refills | Status: DC
  Filled 2021-03-26: qty 5, 25d supply, fill #0
  Filled 2021-05-27: qty 5, 25d supply, fill #1
  Filled 2021-06-17: qty 5, 25d supply, fill #2

## 2021-03-26 MED ORDER — "TUBERCULIN SYRINGE 27G X 1/2"" 1 ML MISC"
3 refills | Status: DC
  Filled 2021-03-26: qty 13, 91d supply, fill #0

## 2021-03-26 MED ORDER — METHOTREXATE SODIUM CHEMO INJECTION 50 MG/2ML
10.0000 mg | INTRAMUSCULAR | 3 refills | Status: DC
  Filled 2021-03-26: qty 2, 30d supply, fill #0

## 2021-03-26 MED FILL — Sertraline HCl Tab 50 MG: ORAL | 30 days supply | Qty: 30 | Fill #1 | Status: AC

## 2021-03-27 ENCOUNTER — Other Ambulatory Visit (HOSPITAL_COMMUNITY): Payer: Self-pay

## 2021-04-01 ENCOUNTER — Other Ambulatory Visit (HOSPITAL_COMMUNITY): Payer: Self-pay

## 2021-04-08 ENCOUNTER — Other Ambulatory Visit (HOSPITAL_COMMUNITY): Payer: Self-pay

## 2021-04-08 MED ORDER — VALACYCLOVIR HCL 1 G PO TABS
1000.0000 mg | ORAL_TABLET | Freq: Every day | ORAL | 2 refills | Status: DC
  Filled 2021-04-08: qty 30, 30d supply, fill #0
  Filled 2021-05-08: qty 30, 30d supply, fill #1
  Filled 2021-06-16: qty 30, 30d supply, fill #2

## 2021-04-08 MED FILL — Ketoconazole Shampoo 2%: CUTANEOUS | 30 days supply | Qty: 120 | Fill #0 | Status: AC

## 2021-04-09 ENCOUNTER — Other Ambulatory Visit (HOSPITAL_COMMUNITY): Payer: Self-pay

## 2021-04-09 MED ORDER — VALACYCLOVIR HCL 1 G PO TABS
1000.0000 mg | ORAL_TABLET | Freq: Every day | ORAL | 3 refills | Status: AC
  Filled 2021-04-09: qty 30, 30d supply, fill #0

## 2021-04-10 ENCOUNTER — Other Ambulatory Visit: Payer: Self-pay | Admitting: Physician Assistant

## 2021-04-10 DIAGNOSIS — N939 Abnormal uterine and vaginal bleeding, unspecified: Secondary | ICD-10-CM

## 2021-04-15 ENCOUNTER — Other Ambulatory Visit (HOSPITAL_COMMUNITY): Payer: Self-pay

## 2021-04-15 MED FILL — Timolol Maleate Ophth Soln 0.5%: OPHTHALMIC | 25 days supply | Qty: 5 | Fill #2 | Status: AC

## 2021-04-18 ENCOUNTER — Other Ambulatory Visit: Payer: No Typology Code available for payment source

## 2021-04-23 ENCOUNTER — Other Ambulatory Visit: Payer: No Typology Code available for payment source

## 2021-04-26 ENCOUNTER — Other Ambulatory Visit (HOSPITAL_COMMUNITY): Payer: Self-pay

## 2021-04-26 MED ORDER — TIMOLOL MALEATE 0.5 % OP SOLN
1.0000 [drp] | Freq: Two times a day (BID) | OPHTHALMIC | 4 refills | Status: DC
  Filled 2021-04-26 – 2021-05-08 (×2): qty 15, 75d supply, fill #0

## 2021-05-01 ENCOUNTER — Other Ambulatory Visit (HOSPITAL_COMMUNITY): Payer: Self-pay

## 2021-05-01 ENCOUNTER — Ambulatory Visit
Admission: RE | Admit: 2021-05-01 | Discharge: 2021-05-01 | Disposition: A | Discharge status: Home / Self Care | Payer: No Typology Code available for payment source | Source: Ambulatory Visit | Attending: Physician Assistant | Admitting: Physician Assistant

## 2021-05-01 DIAGNOSIS — N939 Abnormal uterine and vaginal bleeding, unspecified: Secondary | ICD-10-CM

## 2021-05-01 MED ORDER — PREDNISONE 5 MG PO TABS
ORAL_TABLET | ORAL | 0 refills | Status: DC
  Filled 2021-05-01: qty 48, 12d supply, fill #0

## 2021-05-01 MED FILL — Trazodone HCl Tab 100 MG: ORAL | 30 days supply | Qty: 60 | Fill #2 | Status: AC

## 2021-05-02 ENCOUNTER — Other Ambulatory Visit (HOSPITAL_COMMUNITY): Payer: Self-pay

## 2021-05-06 ENCOUNTER — Other Ambulatory Visit (HOSPITAL_COMMUNITY): Payer: Self-pay

## 2021-05-06 ENCOUNTER — Encounter: Payer: Self-pay | Admitting: Internal Medicine

## 2021-05-06 ENCOUNTER — Other Ambulatory Visit: Payer: Self-pay

## 2021-05-06 ENCOUNTER — Telehealth: Payer: Self-pay

## 2021-05-06 ENCOUNTER — Ambulatory Visit (INDEPENDENT_AMBULATORY_CARE_PROVIDER_SITE_OTHER): Payer: No Typology Code available for payment source | Admitting: Internal Medicine

## 2021-05-06 VITALS — BP 135/94 | HR 84 | Temp 98.1°F

## 2021-05-06 DIAGNOSIS — M069 Rheumatoid arthritis, unspecified: Secondary | ICD-10-CM

## 2021-05-06 DIAGNOSIS — Z227 Latent tuberculosis: Secondary | ICD-10-CM | POA: Insufficient documentation

## 2021-05-06 NOTE — Progress Notes (Signed)
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 Trudie Reed    Patient ID: Bonnie Farmer, female    DOB: 1966/03/17, 55 y.o.   MRN: [id number]  HPI:   Here for evaluation of a positive test. She has a history of systemic lupus erythematosis and rheumatoid arthritis and has been on prednisone and methotrexate.  She had a routine Quantiferon Gold test by Dr. Trudie Reed and was positive x 2.  Previous tests have been negative, by report.  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  She is having no shortness of breath, no cough, no hemoptysis, no weight loss, no night sweats.     Past Medical History:  Diagnosis Date   Allergy    year round, also food & drug allergies   Anxiety    Chronic constipation    Degenerative cervical disc    GERD without esophagitis    Insomnia    Lupus ([facility])    MDD (major depressive disorder), recurrent episode, moderate ([facility])    Memory difficulties    Positive TB test    RA (rheumatoid arthritis) ([place])    SLE (systemic lupus erythematosus) ([place])    Tinea versicolor     Prior to Admission medications   Medication Sig Start Date End Date Taking? Authorizing Provider  albuterol (VENTOLIN HFA) 108 (90 Base) MCG/ACT inhaler Inhale 1-2 puffs into the lungs every 6 (six) hours as needed for wheezing or shortness of breath. 04/17/20   Parrett, Tammy S, NP  amLODipine (NORVASC) 10 MG tablet Take 1 tablet by mouth daily. 10/07/19 10/06/20  [provider]  amLODipine (NORVASC) 10 MG tablet Take 1 tablet (10 mg total) by mouth daily. 02/10/20   Clelland, Olivia M, PA  atropine 1 % ophthalmic solution PLACE 1 DROP INTO THE RIGHT EYE ONCE DAILY. 10/12/20 10/12/21  Jola Schmidt, MD  azaTHIOprine (IMURAN) 50 MG tablet TAKE 1 TABLET BY MOUTH 2 TIMES DAILY 08/21/20 08/21/21  Rosita Kea, PA-C  Brinzolamide-Brimonidine [facility]) 1-0.2 % SUSP INSTILL 1 DROP INTO EACH EYE TWICE DAILY. 11/30/20     budesonide-formoterol (SYMBICORT) 80-4.5 MCG/ACT inhaler Inhale 2 puffs into the lungs 2 (two) times daily. 04/17/20   Parrett, Fonnie Mu, NP  busPIRone (BUSPAR) 10 MG tablet TAKE 1 TABLET BY MOUTH 2 TIMES A DAY AS NEEDED 11/07/20 11/07/21  Clelland, Delrae Rend, PA  busPIRone (BUSPAR) 7.5 MG tablet Take 1 tablet by mouth 2 (two) times daily as needed for anxiety. Patient not taking: Reported on 06/13/2020 10/07/19   [provider]  cephALEXin (KEFLEX) 500 MG capsule TAKE 1 CAPSULE BY MOUTH 2 TIMES DAILY 07/31/20 07/31/21  Le, My Hong, OD  Difluprednate 0.05 % EMUL INSTILL 1 DROP IN BOTH EYES 2 TIMES DAILY FOR 1 MONTH 09/18/20 09/18/21  Melissa Noon, OD  Difluprednate 0.05 % EMUL INSTILL 1 DROP IN BOTH EYES 4 TIMES DAILY (SHAKE WELL) 07/31/20 07/31/21  Le, My Hong, OD  Difluprednate 0.05 % EMUL INSTILL 1 DROP IN BOTH EYES 4 TIMES DAILY. SHAKE WELL BEFORE USING. 07/02/20 07/02/21  Le, My Hong, OD  Difluprednate 0.05 % EMUL INSTILL 1 DROP IN BOTH EYES 4 TIMES DAILY (SHAKE WELL) 05/28/20 05/28/21  Le, My Hong, OD  EPINEPHrine 0.3 mg/0.3 mL IJ SOAJ injection Inject 0.3 mLs into the  muscle once as needed for anaphylaxis. 05/25/19   [provider]  famotidine (PEPCID) 20 MG tablet TAKE 1 TABLET BY MOUTH TWICE DAILY AT BEDTIME AS NEEDED. 06/14/20 06/14/21  Rosita Kea, PA-C  fluconazole (DIFLUCAN) 150 MG tablet TAKE 1 TABLET BY MOUTH IMMEDIATELY 07/31/20 07/31/21  Le, My Hong, OD  fluorometholone (FML) 0.1 % ophthalmic suspension Instill 1 drop in both eyes every day 0000000     folic acid (FOLVITE) 1 MG tablet TAKE 2 TABLETS BY MOUTH DAILY 06/14/20 06/14/21  Rosita Kea, PA-C  gabapentin (NEURONTIN) 300 MG capsule Take 1 capsule (300 mg total) by mouth at bedtime. 05/01/17   Nche, Charlene Brooke, NP  gabapentin (NEURONTIN) 300 MG capsule TAKE 1 CAPSULE BY MOUTH 3 TIMES DAILY AS NEEDED 09/13/20  09/13/21  Rosita Kea, PA-C  gabapentin (NEURONTIN) 300 MG capsule TAKE 1 CAPSULE BY MOUTH 3 TIMES DAILY AS NEEDED 03/21/20 03/21/21  Rosita Kea, PA-C  gabapentin (NEURONTIN) 300 MG capsule Take 1 capsule (300 mg total) by mouth 3 (three) times daily as needed. 03/19/21     HYDROcodone-acetaminophen (NORCO/VICODIN) 5-325 MG tablet Take 1 tablet by mouth every 12 (twelve) hours as needed. 02/07/21     ibuprofen (ADVIL) 800 MG tablet TAKE 1 TABLET BY MOUTH THREE TIMES DAILY AS NEEDED WITH FOOD OR MILK. 06/14/20 06/14/21  Rosita Kea, PA-C  ketoconazole (NIZORAL) 2 % shampoo APPLY 5 TO 10 ML'S EXTERNALLY ONCE DAILY FOR 14 DAYS 07/03/20 07/03/21  Clelland, Minette Brine M, PA  ketorolac (ACULAR) 0.5 % ophthalmic solution INSTILL 1 DROP IN BOTH EYES 2 TIMES DAILY FOR 1 MONTH 09/18/20 09/18/21  Melissa Noon, OD  methotrexate 2.5 MG tablet SMARTSIG:3 Tablet(s) By Mouth Once a Week 03/28/20   [provider]  methotrexate 2.5 MG tablet Take 4 tablets (10 mg total) by mouth once a week. 01/30/21     methotrexate 50 MG/2ML injection Inject 0.4 mLs (10 mg total) into the skin once a week. 03/26/21     methylPREDNISolone (MEDROL DOSEPAK) 4 MG TBPK tablet TAKE ALL 6 TABLETS BY MOUTH ON DAY 1, THEN DECREASE BY ONE TABLET EACH DAY (6-5-4-3-2-1) 07/31/20 07/31/21  Le, My Hong, OD  methylPREDNISolone (MEDROL DOSEPAK) 4 MG TBPK tablet TAKE AS DIRECTED 05/28/20 05/28/21  Le, My Hong, OD  neomycin-polymyxin b-dexamethasone (MAXITROL) 3.5-10000-0.1 OINT APPLY 1 INCH TO LOWER EYELID IN BOTH EYES BEFORE BEDTIME 07/31/20 07/31/21  Le, My Hong, OD  neomycin-polymyxin b-dexamethasone (MAXITROL) 3.5-10000-0.1 OINT APPLY 1 [facility] INTO BOTH LOWER EYE LIDS BEFORE BEDTIME 05/28/20 05/28/21  Le, My Hong, OD  omeprazole (PRILOSEC) 20 MG capsule Take 2 capsules by mouth 2 (two) times daily. 07/20/19   [provider]  prednisoLONE acetate (PRED FORTE) 1 % ophthalmic suspension INSTILL 1 DROP INTO EACH EYE EVERY 2 HOURS FOR 2 DAYS THEN FOUR  TIMES DAILY FOR 1 WEEK. 08/27/20 08/27/21  Melissa Noon, OD  prednisoLONE acetate (PRED FORTE) 1 % ophthalmic suspension Place 1 drop into both eyes 2 (two) times daily. 03/26/21     predniSONE (DELTASONE) 10 MG tablet Take 10 mg by mouth daily as needed.  Patient not taking: Reported on 06/13/2020    [provider]  predniSONE (DELTASONE) 10 MG tablet TAKE 1 TABLET BY MOUTH ONCE DAILY. 05/25/20 05/25/21  Rosita Kea, PA-C  predniSONE (DELTASONE) 5 MG tablet Take as directed 05/01/21     sertraline (ZOLOFT) 100 MG tablet Take 1 tablet (100 mg total) by mouth daily. 12/21/20  timolol (TIMOPTIC) 0.5 % ophthalmic solution Place 1 drop into both eyes 2 (two) times daily. 04/26/21     traZODone (DESYREL) 100 MG tablet Take 100 mg by mouth at bedtime.    [provider]  traZODone (DESYREL) 100 MG tablet TAKE 1 TO 2 TABLETS BY MOUTH BY MOUTH AT BEDTIME, DO NOT TAKE MORE THAN 2 TABLETS DAILY 06/15/20 06/15/21  Clelland, Delrae Rend, PA  traZODone (DESYREL) 100 MG tablet TAKE 1 TABLET BY MOUTH AT BEDTIME ONCE A DAY 03/30/20 03/30/21  Clelland, Delrae Rend, PA  TUBERCULIN SYR 1CC/27GX1/2" 27G X 1/2" 1 ML MISC Use to inject methotrexate subcutaneously once weekly 03/26/21     valACYclovir (VALTREX) 1000 MG tablet Take 1 tablet (1,000 mg total) by mouth daily. 04/08/21     valACYclovir (VALTREX) 1000 MG tablet Take 1 tablet (1,000 mg total) by mouth daily. 04/09/21       Allergies  Allergen Reactions   Peanuts [Peanut Oil] Anaphylaxis   Soy Allergy Anaphylaxis   Penicillins     Yeast infection   Sulfa Antibiotics Rash    Social History   Tobacco Use   Smoking status: Former    Types: Cigarettes    Quit date: 06/09/2011    Years since quitting: 9.9   Smokeless tobacco: Never   Tobacco comments:    approx 1 pack per week off and on   Vaping Use   Vaping Use: Never used  Substance Use Topics   Alcohol use: Yes    Alcohol/week: 2.0 standard drinks    Types: 2 Glasses of wine per week     Comment: social   Drug use: No    Family History  Problem Relation Age of Onset   Heart disease Mother    Mental illness Mother        depression and anxiety   Congestive Heart Failure Mother    Rheum arthritis Mother    Arthritis Father    Hyperlipidemia Father    Other Father        prostate issues   Drug abuse Brother    Mental illness Brother    Bipolar disorder Brother     Review of Systems  Constitutional: negative for fevers and chills Respiratory: negative for cough, sputum, hemoptysis, pleurisy/chest pain, wheezing, or dyspnea on exertion Gastrointestinal: negative for nausea and diarrhea All other systems reviewed and are negative    Constitutional: in no apparent distress There were no vitals filed for this visit. EYES: anicteric Cardiovascular: Cor RRR Respiratory: clear, normal respiratory effort Musculoskeletal: no pedal edema noted Skin: no rash  Labs: Lab Results  Component Value Date   WBC 5.2 11/17/2016   HGB 12.0 11/17/2016   HCT 37.5 11/17/2016   MCV 85.3 11/17/2016   PLT 175.0 11/17/2016    Lab Results  Component Value Date   CREATININE 0.80 11/17/2016   BUN 12 11/17/2016   NA 135 11/17/2016   K 4.0 11/17/2016   CL 102 11/17/2016   CO2 27 11/17/2016    Lab Results  Component Value Date   ALT 8 11/17/2016   AST 11 11/17/2016   ALKPHOS 65 11/17/2016   BILITOT 0.5 11/17/2016     Assessment: latent Tb, previously treated.  I discussed the different tests for latent Tb including the Quantiferon Gold test, nature of antibodies and difficulty in interpreting these tests after previous diagnosis and treatment.  I discussed there is no test of cure and a waxing and waning positive test does not indicated  a new exposure or new concern and that the recommendation in someone previously treated for latent tb remains symptomatic screening with CXR.    Plan: 1)  no indication for further testing with Quantiferon Gold or ppd 2) CXR if concerning  symptoms develop Rtc as needed

## 2021-05-06 NOTE — Telephone Encounter (Signed)
[facility] Patient Teacher, English as a foreign language completed.    The patient is insured through [employer].  Patient will have to do a trial of Tenofovir 300 mg required.  We will continue to follow to see if copay assistance is needed.  Ileene Patrick, [facility] Patient [facility] Phone: [phone] Fax:  [phone]

## 2021-05-08 ENCOUNTER — Other Ambulatory Visit (HOSPITAL_COMMUNITY): Payer: Self-pay

## 2021-05-08 MED ORDER — AMLODIPINE BESYLATE 10 MG PO TABS
10.0000 mg | ORAL_TABLET | Freq: Every day | ORAL | 3 refills | Status: AC
  Filled 2021-05-08: qty 90, 90d supply, fill #0

## 2021-05-21 ENCOUNTER — Other Ambulatory Visit (HOSPITAL_COMMUNITY): Payer: Self-pay

## 2021-05-21 MED ORDER — TRAMADOL HCL 50 MG PO TABS
50.0000 mg | ORAL_TABLET | Freq: Four times a day (QID) | ORAL | 0 refills | Status: DC | PRN
  Filled 2021-05-21: qty 240, 30d supply, fill #0

## 2021-05-22 ENCOUNTER — Other Ambulatory Visit (HOSPITAL_COMMUNITY): Payer: Self-pay

## 2021-05-22 MED ORDER — PREDNISONE 5 MG PO TABS
ORAL_TABLET | ORAL | 0 refills | Status: DC
  Filled 2021-05-22: qty 48, 12d supply, fill #0

## 2021-05-22 MED ORDER — LEFLUNOMIDE 20 MG PO TABS
20.0000 mg | ORAL_TABLET | Freq: Every day | ORAL | 3 refills | Status: AC
  Filled 2021-05-22: qty 30, 30d supply, fill #0
  Filled 2021-06-21: qty 30, 30d supply, fill #1

## 2021-05-27 ENCOUNTER — Other Ambulatory Visit (HOSPITAL_COMMUNITY): Payer: Self-pay

## 2021-06-16 ENCOUNTER — Other Ambulatory Visit (HOSPITAL_COMMUNITY): Payer: Self-pay

## 2021-06-17 ENCOUNTER — Other Ambulatory Visit (HOSPITAL_COMMUNITY): Payer: Self-pay

## 2021-06-18 ENCOUNTER — Other Ambulatory Visit (HOSPITAL_COMMUNITY): Payer: Self-pay

## 2021-06-18 MED ORDER — TRAZODONE HCL 100 MG PO TABS
100.0000 mg | ORAL_TABLET | Freq: Every day | ORAL | 1 refills | Status: AC
  Filled 2021-06-18: qty 60, 30d supply, fill #0

## 2021-06-19 ENCOUNTER — Other Ambulatory Visit (HOSPITAL_COMMUNITY): Payer: Self-pay

## 2021-06-19 MED ORDER — TRAMADOL HCL 50 MG PO TABS
50.0000 mg | ORAL_TABLET | Freq: Four times a day (QID) | ORAL | 2 refills | Status: DC | PRN
  Filled 2021-06-19: qty 240, 30d supply, fill #0

## 2021-06-21 ENCOUNTER — Other Ambulatory Visit (HOSPITAL_COMMUNITY): Payer: Self-pay

## 2021-09-10 ENCOUNTER — Other Ambulatory Visit: Payer: Self-pay

## 2021-09-10 ENCOUNTER — Ambulatory Visit (INDEPENDENT_AMBULATORY_CARE_PROVIDER_SITE_OTHER): Payer: Self-pay | Admitting: Plastic Surgery

## 2021-09-10 ENCOUNTER — Ambulatory Visit
Admission: RE | Admit: 2021-09-10 | Discharge: 2021-09-10 | Disposition: A | Discharge status: Home / Self Care | Source: Ambulatory Visit | Attending: Plastic Surgery | Admitting: Plastic Surgery

## 2021-09-10 ENCOUNTER — Encounter: Payer: Self-pay | Admitting: Plastic Surgery

## 2021-09-10 DIAGNOSIS — Z719 Counseling, unspecified: Secondary | ICD-10-CM

## 2021-09-10 NOTE — Progress Notes (Signed)
Patient ID: Bonnie Farmer, female    DOB: February 25, 1966, 56 y.o.   MRN: [id number]   Chief Complaint  Patient presents with   Breast Problem    The patient is a 56 year old female here for evaluation of her breasts.  She is interested in mastopexy.  She is not wanting them to be smaller but she is okay with a little bit of volume loss in order to lift them.  She is 5 feet 5 inches tall and weighs 180 pounds.  Her current bra size is a DDD.  She had a mammogram in 2021 and it was negative.  She is fine to have it updated.  She does not have diabetes and is not a smoker.  She does not have a family history of breast cancer.     Review of Systems  Constitutional: Negative.   HENT: Negative.    Eyes: Negative.   Respiratory: Negative.    Cardiovascular: Negative.   Gastrointestinal: Negative.   Endocrine: Negative.   Genitourinary: Negative.   Musculoskeletal: Negative.   Skin: Negative.   Psychiatric/Behavioral: Negative.     Past Medical History:  Diagnosis Date   Allergy    year round, also food & drug allergies   Anxiety    Chronic constipation    Degenerative cervical disc    GERD without esophagitis    Insomnia    Lupus (HCC)    MDD (major depressive disorder), recurrent episode, moderate (HCC)    Memory difficulties    Positive TB test    RA (rheumatoid arthritis) ([place])    SLE (systemic lupus erythematosus) ([place])    Tinea versicolor     Past Surgical History:  Procedure Laterality Date   RENAL BIOPSY Left 5 years prior   for lupus   TONSILLECTOMY     UPPER GASTROINTESTINAL ENDOSCOPY        Current Outpatient Medications:    albuterol (VENTOLIN HFA) 108 (90 Base) MCG/ACT inhaler, Inhale 1-2 puffs into the lungs every 6 (six) hours as needed for wheezing or shortness of breath., Disp: 8 g, Rfl: 2   amLODipine (NORVASC) 10 MG tablet, Take 1 tablet (10 mg total) by mouth daily., Disp: 90 tablet, Rfl: 3   EPINEPHrine 0.3 mg/0.3 mL IJ SOAJ injection,  Inject 0.3 mLs into the muscle once as needed for anaphylaxis., Disp: , Rfl:    gabapentin (NEURONTIN) 300 MG capsule, Take 1 capsule (300 mg total) by mouth at bedtime., Disp: 90 capsule, Rfl: 1   gabapentin (NEURONTIN) 300 MG capsule, TAKE 1 CAPSULE BY MOUTH 3 TIMES DAILY AS NEEDED, Disp: 90 capsule, Rfl: 1   ketorolac (ACULAR) 0.5 % ophthalmic solution, INSTILL 1 DROP IN BOTH EYES 2 TIMES DAILY FOR 1 MONTH, Disp: 5 mL, Rfl: 3   leflunomide (ARAVA) 20 MG tablet, Take 1 tablet (20 mg total) by mouth daily., Disp: 30 tablet, Rfl: 3   methotrexate 2.5 MG tablet, , Disp: , Rfl:    methotrexate 2.5 MG tablet, Take 4 tablets (10 mg total) by mouth once a week., Disp: 48 tablet, Rfl: 2   methotrexate 50 MG/2ML injection, Inject 0.4 mLs (10 mg total) into the skin once a week., Disp: 2 mL, Rfl: 3   omeprazole (PRILOSEC) 20 MG capsule, Take 2 capsules by mouth 2 (two) times daily., Disp: , Rfl:    sertraline (ZOLOFT) 100 MG tablet, Take 1 tablet (100 mg total) by mouth daily., Disp: 30 tablet, Rfl: 6   timolol (TIMOPTIC) 0.5 %  ophthalmic solution, Place 1 drop into both eyes 2 (two) times daily., Disp: 15 mL, Rfl: 4   traMADol (ULTRAM) 50 MG tablet, Take 1-2 tablets (50-100 mg total) by mouth every 6 (six) hours as needed for pain, Disp: 240 tablet, Rfl: 2   traZODone (DESYREL) 100 MG tablet, Take 100 mg by mouth at bedtime., Disp: , Rfl:    traZODone (DESYREL) 100 MG tablet, Take 1-2 tablets (100-200 mg total) by mouth at bedtime. DO NOT TAKE MORE THAN 2 TABLETS DAILY, Disp: 60 tablet, Rfl: 1   TUBERCULIN SYR 1CC/27GX1/2" 27G X 1/2" 1 ML MISC, Use to inject methotrexate subcutaneously once weekly, Disp: 13 each, Rfl: 3   valACYclovir (VALTREX) 1000 MG tablet, Take 1 tablet (1,000 mg total) by mouth daily., Disp: 30 tablet, Rfl: 2   valACYclovir (VALTREX) 1000 MG tablet, Take 1 tablet (1,000 mg total) by mouth daily., Disp: 30 tablet, Rfl: 3   amLODipine (NORVASC) 10 MG tablet, Take 1 tablet by mouth  daily., Disp: , Rfl:    traZODone (DESYREL) 100 MG tablet, TAKE 1 TO 2 TABLETS BY MOUTH BY MOUTH AT BEDTIME, DO NOT TAKE MORE THAN 2 TABLETS DAILY (Patient not taking: Reported on 05/06/2021), Disp: 60 tablet, Rfl: 5   traZODone (DESYREL) 100 MG tablet, TAKE 1 TABLET BY MOUTH AT BEDTIME ONCE A DAY, Disp: 30 tablet, Rfl: 2   Objective:   Vitals:   09/10/21 1109  BP: 130/88  Pulse: 91  SpO2: 100%    Physical Exam Vitals reviewed.  Constitutional:      Appearance: Normal appearance.  HENT:     Head: Normocephalic and atraumatic.  Cardiovascular:     Rate and Rhythm: Normal rate.     Pulses: Normal pulses.  Pulmonary:     Effort: Pulmonary effort is normal.  Abdominal:     General: There is no distension.     Palpations: Abdomen is soft.     Tenderness: There is no abdominal tenderness.  Musculoskeletal:        General: No swelling or deformity.  Skin:    General: Skin is warm.     Capillary Refill: Capillary refill takes less than 2 seconds.     Coloration: Skin is not jaundiced.     Findings: No bruising.  Neurological:     Mental Status: She is alert and oriented to person, place, and time.  Psychiatric:        Mood and Affect: Mood normal.        Behavior: Behavior normal.        Thought Content: Thought content normal.    Assessment & Plan:  Encounter for counseling  We will provide the patient with a quote for bilateral mastopexy with lateral liposuction.  Pictures were obtained of the patient and placed in the chart with the patient's or guardian's permission.  She will get the mammogram.   Loel Lofty Mysha Peeler, DO

## 2021-09-25 ENCOUNTER — Encounter: Payer: Self-pay | Admitting: Surgical

## 2021-09-25 ENCOUNTER — Ambulatory Visit (INDEPENDENT_AMBULATORY_CARE_PROVIDER_SITE_OTHER): Admitting: Surgical

## 2021-09-25 ENCOUNTER — Other Ambulatory Visit: Payer: Self-pay

## 2021-09-25 VITALS — BP 160/90 | HR 84 | Ht 65.0 in | Wt 178.8 lb

## 2021-09-25 DIAGNOSIS — Z719 Counseling, unspecified: Secondary | ICD-10-CM

## 2021-09-25 MED ORDER — ONDANSETRON HCL 4 MG PO TABS: 4.0000 mg | ORAL_TABLET | Freq: Three times a day (TID) | ORAL | 0 refills | Status: DC | PRN

## 2021-09-25 MED ORDER — HYDROCODONE-ACETAMINOPHEN 7.5-325 MG PO TABS: 1.0000 | ORAL_TABLET | Freq: Four times a day (QID) | ORAL | 0 refills | Status: AC | PRN

## 2021-09-25 MED ORDER — CEPHALEXIN 500 MG PO CAPS: 500.0000 mg | ORAL_CAPSULE | Freq: Four times a day (QID) | ORAL | 0 refills | Status: AC

## 2021-09-25 NOTE — Progress Notes (Addendum)
Patient ID: Bonnie Farmer, female    DOB: 05/07/1966, 56 y.o.   MRN: [id number]  Chief Complaint  Patient presents with   Pre-op Exam      ICD-10-CM   1. Encounter for counseling  Z71.9       History of Present Illness: Bonnie Farmer is a 56 y.o.  female .  She presents for preoperative evaluation for upcoming procedure, bilateral mastopexy with liposuction, scheduled for 10/07/2021 with Dr. Marla Roe.  The patient has not had problems with anesthesia. No history of DVT/PE.  No family history of DVT/PE.  No family or personal history of bleeding or clotting disorders.  Patient is not currently taking any blood thinners.  No history of CVA/MI.   Summary of Previous Visit: Current bra size is 38 triple D  Job: Nurse  PMH Significant for: Rheumatoid arthritis, SLE, GERD, tuberculosis (treated), HTN, asthma  Patient saw pulmonology in August 2021, pulmonary function test showed mild airflow obstruction, possibly a component of asthma.  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  Per ID note, no indication for further testing with QuantiFERON gold or PPD, chest x-ray if concerning symptoms develop.  Follow-up as needed.  Patient is currently on leflunomide.  She saw her rheumatologist yesterday and they discussed stopping Remicade and methotrexate.  Last dose of Remicade was approximately September 09, 2021.  Last dose of methotrexate was yesterday.  Patient had echocardiogram 11/12/2018, this was normal.  Patient had mammogram on 09/10/2021 which was normal.  Patient reports she is feeling well, has not had any recent changes to her health.  She is excited for her surgery.  Past Medical History: Allergies: Allergies  Allergen Reactions   Peanuts  [Peanut Oil] Anaphylaxis   Soy Allergy Anaphylaxis   Penicillins     Yeast infection   Sulfa Antibiotics Rash    Current Medications:  Current Outpatient Medications:    albuterol (VENTOLIN HFA) 108 (90 Base) MCG/ACT inhaler, Inhale 1-2 puffs into the lungs every 6 (six) hours as needed for wheezing or shortness of breath., Disp: 8 g, Rfl: 2   amLODipine (NORVASC) 10 MG tablet, Take 1 tablet (10 mg total) by mouth daily., Disp: 90 tablet, Rfl: 3   EPINEPHrine 0.3 mg/0.3 mL IJ SOAJ injection, Inject 0.3 mLs into the muscle once as needed for anaphylaxis., Disp: , Rfl:    gabapentin (NEURONTIN) 300 MG capsule, Take 1 capsule (300 mg total) by mouth at bedtime., Disp: 90 capsule, Rfl: 1   leflunomide (ARAVA) 20 MG tablet, Take 1 tablet (20 mg total) by mouth daily., Disp: 30 tablet, Rfl: 3   omeprazole (PRILOSEC) 20 MG capsule, Take 2 capsules by mouth 2 (two) times daily., Disp: , Rfl:    sertraline (ZOLOFT) 100 MG tablet, Take 1 tablet (100 mg total) by mouth daily., Disp: 30 tablet, Rfl: 6   timolol (TIMOPTIC) 0.5 % ophthalmic solution, Place 1 drop into both eyes 2 (two) times daily., Disp: 15 mL, Rfl: 4   traZODone (DESYREL) 100 MG tablet, Take 100 mg by mouth at bedtime., Disp: , Rfl:    traZODone (DESYREL) 100 MG tablet, Take 1-2 tablets (100-200 mg total) by mouth at bedtime. DO NOT TAKE MORE THAN 2 TABLETS DAILY, Disp: 60 tablet, Rfl: 1  TUBERCULIN SYR 1CC/27GX1/2" 27G X 1/2" 1 ML MISC, Use to inject methotrexate subcutaneously once weekly, Disp: 13 each, Rfl: 3   valACYclovir (VALTREX) 1000 MG tablet, Take 1 tablet (1,000 mg total) by mouth daily., Disp: 30 tablet, Rfl: 2   valACYclovir (VALTREX) 1000 MG tablet, Take 1 tablet (1,000 mg total) by mouth daily., Disp: 30 tablet, Rfl: 3   gabapentin (NEURONTIN) 300 MG capsule, TAKE 1 CAPSULE BY MOUTH 3 TIMES DAILY AS NEEDED, Disp: 90 capsule, Rfl: 1  Past Medical Problems: Past Medical History:  Diagnosis Date   Allergy    year  round, also food & drug allergies   Anxiety    Chronic constipation    Degenerative cervical disc    GERD without esophagitis    Insomnia    Lupus (HCC)    MDD (major depressive disorder), recurrent episode, moderate (HCC)    Memory difficulties    Positive TB test    RA (rheumatoid arthritis) (HCC)    SLE (systemic lupus erythematosus) ([place])    Tinea versicolor     Past Surgical History: Past Surgical History:  Procedure Laterality Date   RENAL BIOPSY Left 5 years prior   for lupus   TONSILLECTOMY     UPPER GASTROINTESTINAL ENDOSCOPY      Social History: Social History   Socioeconomic History   Marital status: Single    Spouse name: Not on file   Number of children: 1   Years of education: trade   Highest education level: Not on file  Occupational History   Occupation: Facilities manager: [employer]  Tobacco Use   Smoking status: Former    Types: Cigarettes    Quit date: 06/09/2011    Years since quitting: 10.3   Smokeless tobacco: Never   Tobacco comments:    approx 1 pack per week off and on   Vaping Use   Vaping Use: Never used  Substance and Sexual Activity   Alcohol use: Yes    Alcohol/week: 2.0 standard drinks    Types: 2 Glasses of wine per week    Comment: social   Drug use: No   Sexual activity: Yes    Partners: Male    Birth control/protection: Condom  Other Topics Concern   Not on file  Social History Narrative   Originally from [place]--grew up in [place], lived in [place] to [place] in 2005   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 Daughter born 60   Social Determinants of Health   Financial Resource Strain: Not on file  Food Insecurity: Not on file  Transportation Needs: Not on file  Physical Activity: Not on file  Stress: Not on file  Social Connections: Not on file  Intimate Partner Violence: Not on file    Family History: Family History  Problem Relation Age of Onset    Heart disease Mother    Mental illness Mother        depression and anxiety   Congestive Heart Failure Mother    Rheum arthritis Mother    Arthritis Father    Hyperlipidemia Father    Other Father        prostate issues   Drug abuse Brother    Mental illness Brother    Bipolar disorder Brother     Review of Systems: Review of Systems  Constitutional: Negative.   Cardiovascular: Negative.    Physical Exam: Vital Signs BP Marland Kitchen)  160/90 (BP Location: Left Arm, Patient Position: Sitting, Cuff Size: Normal)    Pulse 84    Ht 5\' 5"  (1.651 m)    Wt 178 lb 12.8 oz (81.1 kg)    SpO2 99%    BMI 29.75 kg/m   Physical Exam  Constitutional:      General: Not in acute distress.    Appearance: Normal appearance. Not ill-appearing.  HENT:     Head: Normocephalic and atraumatic.  Eyes:     Pupils: Pupils are equal, round Neck:     Musculoskeletal: Normal range of motion.  Cardiovascular:     Rate and Rhythm: Normal rate    Pulses: Normal pulses.  Pulmonary:     Effort: Pulmonary effort is normal. No respiratory distress.  Musculoskeletal: Normal range of motion.  Skin:    General: Skin is warm and dry.     Findings: No erythema or rash.  Neurological:     General: No focal deficit present.     Mental Status: Alert and oriented to person, place, and time. Mental status is at baseline.     Motor: No weakness.  Psychiatric:        Mood and Affect: Mood normal.        Behavior: Behavior normal.    Assessment/Plan: The patient is scheduled for bilateral breast mastopexy with liposuction with Dr. Marla Roe.  Risks, benefits, and alternatives of procedure discussed, questions answered and consent obtained.    Smoking Status: quit smoking 5-7 yrs ago; Counseling Given? N/a Last Mammogram: 09/10/2021; Results: Negative  Caprini Score: 4, moderate; Risk Factors include: Age, BMI greater than 25, and length of planned surgery. Recommendation for mechanical prophylaxis. Encourage early  ambulation.   Pictures obtained: @consult   Post-op Rx sent to pharmacy: Norco, Zofran, Keflex  Patient was provided with the General Surgical Risk consent document and Pain Medication Agreement prior to their appointment.  They had adequate time to read through the risk consent documents and Pain Medication Agreement. We also discussed them in person together during this preop appointment. All of their questions were answered to their satisfaction.  Recommended calling if they have any further questions.  Risk consent form and Pain Medication Agreement to be scanned into patient's chart.  The risk that can be encountered with mastopexy/breast lift were discussed and include the following but not limited to these:  Breast asymmetry, fluid accumulation, firmness of the breast, inability to breast feed, loss of nipple or areola, skin loss, decrease or no nipple sensation, fat necrosis of the breast tissue, bleeding, infection, healing delay.  There are risks of anesthesia, changes to skin sensation and injury to nerves or blood vessels.  The muscle can be temporarily or permanently injured.  You may have an allergic reaction to tape, suture, glue, blood products which can result in skin discoloration, swelling, pain, skin lesions, poor healing.  Any of these can lead to the need for revisonal surgery or stage procedures.  A mastopexy has potential to interfere with diagnostic procedures.  Nipple or breast piercing can increase risks of infection.  This procedure is best done when the breast is fully developed.  Changes in the breast will continue to occur over time.  Pregnancy can alter the outcomes of previous breast surgery, weight gain and weigh loss can also effect the long term appearance.   We will discuss holding leflunomide with patient's rheumatologist.  Called rheumatologist office today to request additional information about holding prior to surgery.  Patient is aware that  she may be at increased  risk of postoperative wound healing complications related to her DMARDs and rheumatoid/lupus related medications.    Addendum: Spoke with Estill Bamberg, nurse for Dr. Dossie Der at [facility].  They state that they have discontinued methotrexate and folic acid for this patient.  They have also discontinued Remicade at this time, they are going to consider other Biologics but this will be after surgery.  She is currently on leflunomide and per Dr. Dossie Der this is not necessary to hold prior to surgery.  Patient is also aware of her Estill Bamberg, nurse.  Appreciate them calling and informing us.  Electronically signed by: Carola Rhine Hudson Majkowski, PA-C 09/25/2021 11:53 AM

## 2021-10-07 ENCOUNTER — Other Ambulatory Visit: Payer: Self-pay | Admitting: Surgical

## 2021-10-07 HISTORY — PX: MASTOPEXY: SUR857

## 2021-10-07 MED ORDER — FLUCONAZOLE 150 MG PO TABS: 150.0000 mg | ORAL_TABLET | Freq: Once | ORAL | 1 refills | Status: AC

## 2021-10-09 ENCOUNTER — Telehealth: Payer: Self-pay

## 2021-10-09 MED ORDER — OXYCODONE HCL 5 MG PO CAPS: 5.0000 mg | ORAL_CAPSULE | Freq: Four times a day (QID) | ORAL | 0 refills | Status: AC | PRN

## 2021-10-09 NOTE — Telephone Encounter (Signed)
Patient called to say she had surgery with [place] on Monday and her pain medication is not working.  She would like to see if we can prescribe her something else.  Also, she said she has a yeast infection from the antibiotic and would like for [place] to call in some Diflucan for her.  Please call.  *Patient's preferred pharmacy is [facility] on the corner of [address].

## 2021-10-09 NOTE — Telephone Encounter (Signed)
Patient called after bilateral breast reduction surgery on Monday, 10/07/2021 with Dr. Marla Roe.  She reports that she is having ongoing surgical pain that is uncontrolled by the Norco 7.5.  She has been using Tylenol and ibuprofen as well.  She feels as if it is not controlling her pain well enough.    Discussed with patient we can do a trial of oxycodone 5 mg as needed for elevated pain levels.  We discussed to not use oxycodone in conjunction with Norco, patient was understanding and in agreement with this.  Discussed with patient I also sent previous prescription for Diflucan to her pharmacy on 10/07/2021.  Recommend calling with further questions or concerns.

## 2021-10-18 ENCOUNTER — Other Ambulatory Visit: Payer: Self-pay

## 2021-10-18 ENCOUNTER — Encounter: Payer: Self-pay | Admitting: Plastic Surgery

## 2021-10-18 ENCOUNTER — Encounter: Admitting: Plastic Surgery

## 2021-10-18 ENCOUNTER — Ambulatory Visit (INDEPENDENT_AMBULATORY_CARE_PROVIDER_SITE_OTHER): Payer: Self-pay | Admitting: Plastic Surgery

## 2021-10-18 DIAGNOSIS — Z719 Counseling, unspecified: Secondary | ICD-10-CM

## 2021-10-18 NOTE — Progress Notes (Signed)
The patient is a 56 year old female here for follow-up on her bilateral breast reduction.  She is doing extremely well.  And very happy with her results.  The dressing was a little bit wet so I went ahead and removed it and replaced her Steri-Strips.  Continue with the sports bra and follow-up in 2 weeks.  We will get a picture at that time.

## 2021-10-31 NOTE — Progress Notes (Signed)
56 year old female here for follow-up after bilateral mastopexy with liposuction on 10/07/2021 with Dr. Marla Roe.  She is 3-1/2 weeks postop. ? ?She is overall doing really well, she is very pleased.  She is here with her daughter. ? ?Chaperone present on exam ?On exam bilateral NAC's are viable, bilateral breast incisions are overall intact, she does have a few small pinpoint wounds, particular at the left T-junction and left vertical limb and NAC complex junction.  There is no erythema or cellulitic changes.  No subcutaneous fluid collections noted. ? ?Recommend continue with compressive garments for a few more weeks, recommend avoiding strenuous activities.  Recommend follow-up as needed.  Vaseline and gauze to breast wounds. ? ?Can begin using scar cream in a few weeks once the wounds have healed. ? ?Pictures were obtained of the patient and placed in the chart with the patient's or guardian's permission. ? ?

## 2021-11-01 ENCOUNTER — Other Ambulatory Visit: Payer: Self-pay

## 2021-11-01 ENCOUNTER — Ambulatory Visit (INDEPENDENT_AMBULATORY_CARE_PROVIDER_SITE_OTHER): Payer: Self-pay | Admitting: Surgical

## 2021-11-01 DIAGNOSIS — Z719 Counseling, unspecified: Secondary | ICD-10-CM

## 2022-05-20 ENCOUNTER — Encounter (HOSPITAL_BASED_OUTPATIENT_CLINIC_OR_DEPARTMENT_OTHER): Payer: Self-pay | Admitting: Obstetrics and Gynecology

## 2022-05-22 ENCOUNTER — Other Ambulatory Visit: Payer: Self-pay

## 2022-05-22 ENCOUNTER — Encounter (HOSPITAL_BASED_OUTPATIENT_CLINIC_OR_DEPARTMENT_OTHER): Payer: Self-pay | Admitting: Obstetrics and Gynecology

## 2022-05-22 NOTE — Progress Notes (Signed)
Spoke w/ via phone for pre-op interview--Cora "Zigmund Daniel" Lab needs dos---- urine pregnancy per anesthesia, surgeon orders pending a s of 05/22/22             Lab results------06/06/22 lab appt for cbc, bmp, type & screen, ekg, 11/01/2018 Echocardiogram, EF 65 - 70% in [place] test -----patient states asymptomatic no test needed Arrive at -------0530 on Tuesday, 06/10/22 NPO after MN NO Solid Food.  Clear liquids from MN until---0430 Med rec completed Medications to take morning of surgery -----Albuterol, Amlodipine, Allegra, Flonase, Omeprazole, Zoloft, Valtrex Diabetic medication -----n/a Patient instructed no nail polish to be worn day of surgery Patient instructed to bring photo id and insurance card day of surgery Patient aware to have Driver (ride ) / caregiver    for 24 hours after surgery - husband or daughter Patient Special Instructions -----Extended / overnight stay instructions given. Bring inhaler on day of surgery. Pre-Op special Istructions -----Requested orders from Dr. Landry Mellow on 05/20/22 via Epic IB. Patient verbalized understanding of instructions that were given at this phone interview. Patient denies shortness of breath, chest pain, fever, cough at this phone interview.

## 2022-05-22 NOTE — Progress Notes (Signed)
Your procedure is scheduled on Tuesday, 06/10/22.  Report to [facility].   Call this number if you have problems the morning of surgery  :[phone].   OUR ADDRESS IS [place].  WE ARE LOCATED IN [facility].  PLEASE BRING YOUR INSURANCE CARD AND PHOTO ID DAY OF SURGERY.  ONLY 2 PEOPLE ARE ALLOWED IN  WAITING  ROOM.                                      REMEMBER:  DO NOT EAT FOOD, CANDY GUM OR MINTS  AFTER MIDNIGHT THE NIGHT BEFORE YOUR SURGERY . YOU MAY HAVE CLEAR LIQUIDS FROM MIDNIGHT THE NIGHT BEFORE YOUR SURGERY UNTIL  4:30 AM. NO CLEAR LIQUIDS AFTER   4:30 AM DAY OF SURGERY.  YOU MAY  BRUSH YOUR TEETH MORNING OF SURGERY AND RINSE YOUR MOUTH OUT, NO CHEWING GUM CANDY OR MINTS.     CLEAR LIQUID DIET   Foods Allowed                                                                     Foods Excluded  Coffee and tea, regular and decaf                             liquids that you cannot  Plain Jell-O                                                                   see through such as: Fruit ices (not with fruit pulp)                                     milk, soups, orange juice  Plain  Popsicles                                    All solid food Carbonated beverages, regular and diet                                    Cranberry, grape and apple juices Sports drinks like Gatorade _____________________________________________________________________     TAKE ONLY THESE MEDICATIONS MORNING OF SURGERY: Albuterol inhaler (please bring on day of surgery), amlodipine, Allegra, Flonase, omeprazole, Zoloft, Valtrex    UP TO 4 VISITORS  MAY VISIT IN THE EXTENDED RECOVERY ROOM UNTIL 800 PM ONLY.  ONE  VISITOR AGE 54 AND OVER MAY SPEND THE NIGHT AND MUST BE IN EXTENDED RECOVERY ROOM NO LATER THAN 800 PM . YOUR DISCHARGE TIME AFTER YOU SPEND THE NIGHT IS 900 AM THE MORNING AFTER YOUR SURGERY.  YOU MAY PACK A SMALL OVERNIGHT  BAG WITH TOILETRIES FOR YOUR OVERNIGHT STAY IF YOU WISH.  YOUR PRESCRIPTION MEDICATIONS WILL BE PROVIDED DURING [facility].                                      DO NOT WEAR JEWERLY, MAKE UP. DO NOT WEAR LOTIONS, POWDERS, PERFUMES OR NAIL POLISH ON YOUR FINGERNAILS. TOENAIL POLISH IS OK TO WEAR. DO NOT SHAVE FOR 48 HOURS PRIOR TO DAY OF SURGERY. MEN MAY SHAVE FACE AND NECK. CONTACTS, GLASSES, OR DENTURES MAY NOT BE WORN TO SURGERY.  REMEMBER: NO SMOKING, DRUGS OR ALCOHOL FOR 24 HOURS BEFORE YOUR SURGERY.                                    [facility] IS NOT RESPONSIBLE  FOR ANY BELONGINGS.                                                                    Marland Kitchen           [facility] - Preparing for Surgery Before surgery, you can play an important role.  Because skin is not sterile, your skin needs to be as free of germs as possible.  You can reduce the number of germs on your skin by washing with CHG (chlorahexidine gluconate) soap before surgery.  CHG is an antiseptic cleaner which kills germs and bonds with the skin to continue killing germs even after washing. Please DO NOT use if you have an allergy to CHG or antibacterial soaps.  If your skin becomes reddened/irritated stop using the CHG and inform your nurse when you arrive at Short Stay. Do not shave (including legs and underarms) for at least 48 hours prior to the first CHG shower.  You may shave your face/neck. Please follow these instructions carefully:  1.  Shower with CHG Soap the night before surgery and the  morning of Surgery.  2.  If you choose to wash your hair, wash your hair first as usual with your  normal  shampoo.  3.  After you shampoo, rinse your hair and body thoroughly to remove the  shampoo.                                        4.  Use CHG as you would any other liquid soap.  You can apply chg directly  to the skin and wash , chg soap provided, night before and morning of your surgery.  5.  Apply the CHG Soap  to your body ONLY FROM THE NECK DOWN.   Do not use on face/ open                           Wound or open sores. Avoid contact with eyes, ears mouth and genitals (private parts).  Wash face,  Genitals (private parts) with your normal soap.             6.  Wash thoroughly, paying special attention to the area where your surgery  will be performed.  7.  Thoroughly rinse your body with warm water from the neck down.  8.  DO NOT shower/wash with your normal soap after using and rinsing off  the CHG Soap.             9.  Pat yourself dry with a clean towel.            10.  Wear clean pajamas.            11.  Place clean sheets on your bed the night of your first shower and do not  sleep with pets. Day of Surgery : Do not apply any lotions/deodorants the morning of surgery.  Please wear clean clothes to the hospital/surgery center.  IF YOU HAVE ANY SKIN IRRITATION OR PROBLEMS WITH THE SURGICAL SOAP, PLEASE GET A BAR OF GOLD DIAL SOAP AND SHOWER THE NIGHT BEFORE YOUR SURGERY AND THE MORNING OF YOUR SURGERY. PLEASE LET THE NURSE KNOW MORNING OF YOUR SURGERY IF YOU HAD ANY PROBLEMS WITH THE SURGICAL SOAP.   ________________________________________________________________________                                                        QUESTIONS [facility] PRE OP NURSE PHONE [phone].

## 2022-06-06 ENCOUNTER — Encounter (HOSPITAL_COMMUNITY)
Admission: RE | Admit: 2022-06-06 | Discharge: 2022-06-06 | Disposition: A | Discharge status: Home / Self Care | Source: Ambulatory Visit | Attending: Obstetrics and Gynecology | Admitting: Obstetrics and Gynecology

## 2022-06-06 DIAGNOSIS — Z01818 Encounter for other preprocedural examination: Secondary | ICD-10-CM | POA: Diagnosis not present

## 2022-06-06 LAB — BASIC METABOLIC PANEL
Anion gap: 6 (ref 5–15)
BUN: 8 mg/dL (ref 6–20)
CO2: 23 mmol/L (ref 22–32)
Calcium: 9.1 mg/dL (ref 8.9–10.3)
Chloride: 108 mmol/L (ref 98–111)
Creatinine, Ser: 0.69 mg/dL (ref 0.44–1.00)
GFR, Estimated: >60 mL/min (ref >60)
Glucose, Bld: 91 mg/dL (ref 70–99)
Potassium: 3.4 mmol/L — ABNORMAL LOW (ref 3.5–5.1)
Sodium: 137 mmol/L (ref 135–145)

## 2022-06-06 LAB — CBC
HCT: 33.1 % — ABNORMAL LOW (ref 36.0–46.0)
Hemoglobin: 10.5 g/dL — ABNORMAL LOW (ref 12.0–15.0)
MCH: 25.1 pg — ABNORMAL LOW (ref 26.0–34.0)
MCHC: 31.7 g/dL (ref 30.0–36.0)
MCV: 79 fL — ABNORMAL LOW (ref 80.0–100.0)
Platelets: 159 10*3/uL (ref 150–400)
RBC: 4.19 MIL/uL (ref 3.87–5.11)
RDW: 18.5 % — ABNORMAL HIGH (ref 11.5–15.5)
WBC: 3.2 10*3/uL — ABNORMAL LOW (ref 4.0–10.5)
nRBC: 0 % (ref 0.0–0.2)

## 2022-06-06 NOTE — Progress Notes (Signed)
Pt had pat lab appointment today for surgery on 06-10-2022 by Dr Landry Mellow.  Notified by lab tech that pt's first bp was 151/ 101 , pt stated she had not take her bp medication this morning and was very anxious about being stuck with needed.  Bp checked again , 135/101.  Pt told to follow the instructions given to take bp med morning surgery prior to arriving.

## 2022-06-09 ENCOUNTER — Other Ambulatory Visit: Payer: Self-pay | Admitting: Obstetrics and Gynecology

## 2022-06-09 DIAGNOSIS — D219 Benign neoplasm of connective and other soft tissue, unspecified: Secondary | ICD-10-CM

## 2022-06-09 NOTE — H&P (Signed)
  The note originally documented on this encounter has been moved the the encounter in which it belongs.  

## 2022-06-09 NOTE — Anesthesia Preprocedure Evaluation (Addendum)
Anesthesia Evaluation  Patient identified by MRN, date of birth, ID band Patient awake    Reviewed: Allergy & Precautions, NPO status , Patient's Chart, lab work & pertinent test results  History of Anesthesia Complications Negative for: history of anesthetic complications  Airway Mallampati: II  TM Distance: >3 FB Neck ROM: Full    Dental no notable dental hx. (+) Dental Advisory Given   Pulmonary asthma , former smoker,    Pulmonary exam normal        Cardiovascular hypertension, Pt. on medications Normal cardiovascular exam     Neuro/Psych PSYCHIATRIC DISORDERS Anxiety Depression negative neurological ROS     GI/Hepatic Neg liver ROS, GERD  Medicated,  Endo/Other  negative endocrine ROSSLE  Renal/GU negative Renal ROS     Musculoskeletal  (+) Arthritis , Rheumatoid disorders,    Abdominal   Peds  Hematology negative hematology ROS (+)   Anesthesia Other Findings   Reproductive/Obstetrics                            Anesthesia Physical Anesthesia Plan  ASA: 2  Anesthesia Plan: General   Post-op Pain Management: Tylenol PO (pre-op)*   Induction: Intravenous  PONV Risk Score and Plan: 4 or greater and Ondansetron, Dexamethasone, Midazolam and Scopolamine patch - Pre-op  Airway Management Planned: Oral ETT  Additional Equipment:   Intra-op Plan:   Post-operative Plan: Extubation in OR  Informed Consent: I have reviewed the patients History and Physical, chart, labs and discussed the procedure including the risks, benefits and alternatives for the proposed anesthesia with the patient or authorized representative who has indicated his/her understanding and acceptance.     Dental advisory given  Plan Discussed with: Anesthesiologist and CRNA  Anesthesia Plan Comments:        Anesthesia Quick Evaluation

## 2022-06-09 NOTE — H&P (Signed)
  The note originally documented on this encounter has been moved the the encounter in which it belongs.    Nipple discharge no.  Pain with intercourse no.  Pelvic pain yes.  Unusual vaginal discharge no.  Vaginal itching no.     MUSCULOSKELETAL:         Muscle aches no.     NEUROLOGY:         Headache no.  Tingling/numbness no.  Weakness no.     PSYCHOLOGY:         Depression no.  Anxiety no.  Nervousness no.  Sleep disturbances no.  Suicidal ideation no .     ENDOCRINOLOGY:         Excessive thirst no.  Excessive urination no.  Hair loss no.  Heat or cold intolerance no.     HEMATOLOGY/LYMPH:         Abnormal bleeding no.  Easy bruising no.  Swollen glands no.     DERMATOLOGY:         New/changing skin lesion no.  Rash no.  Sores no.     Negative except as stated in HPI. Vital Signs Wt: 163.4, Wt change: -4.2 lbs, Ht: 65, BMI: 27.19, Pulse sitting: 62, BP sitting: 125/85. Examination General Examination:        CONSTITUTIONAL: alert, oriented, NAD. SKIN:  moist, warm. EYES:  Conjunctiva clear. LUNGS: good I:E efffort noted.., clear to auscultation bilaterally. HEART: no murmurs, regular rate and rhythm. ABDOMEN: soft, non-tender/non-distended, bowel sounds present. FEMALE GENITOURINARY: labia - unremarkable, vagina - pink moist mucosa, no lesions or abnormal discharge, cervix - no discharge or lesions or CMT, adnexa - no masses or tenderness, uterus - tender to palpation...  normal size on palpation. PSYCH:  affect normal, good eye contact.  Physical Examination Chaperone present:         Chaperone present  Student Olivia Tapp Guest13, eCW 05/19/2022 10:47:43 AM >, for pelvic exam.      Assessments 1. Abnormal uterine bleeding - N93.9 (Primary) 2. Fibroids - D21.9 3. Pelvic pain - R10.2 4. Vaginal discharge - N89.8 Treatment 1. Abnormal uterine bleeding  Notes: Notes: r/b/a of robotic  assisted laparoscopic hysterectomy with bilateral salpingectomy discussed with the patient. including but not limited to infection/ bleeding / damage to bowel bladder ureters as well as conversion to laparotomy with the need for further surgery. pt voiced understanding and would like to proceed with robotic assisted laparoscopic hysterectomy with bilateral salpingectomy.. r/o trasnfusion discussed. HIV/ HEP B and C.. she is advised to avoid NSAIDS between now and surgery. she is advised not to eat or drink after midninght the night prior to surgery.Marland Kitchen she will not be able to drive for 1 week after surgery. she will need to avoid heavy lifting over 10 lbs and sex for at least 6 weeks after surgery.    2. Fibroids  Notes: Notes: r/b/a of robotic assisted laparoscopic hysterectomy with bilateral salpingectomy discussed with the patient. including but not limited to infection/ bleeding / damage to bowel bladder ureters as well as conversion to laparotomy with the need for further surgery. pt voiced understanding and would like to proceed with robotic assisted laparoscopic hysterectomy with bilateral salpingectomy.. r/o trasnfusion discussed. HIV/ HEP B and C.. she is advised to avoid NSAIDS between now and surgery. she is advised not to eat or drink after midninght the night prior to surgery.Marland Kitchen she will not be able to drive for 1 week after surgery. she will need to avoid heavy lifting over 10 lbs and sex for at least 6 weeks after surgery.    3. Pelvic pain  Notes: Notes:  r/b/a of robotic assisted laparoscopic hysterectomy with bilateral salpingectomy discussed with the patient. including but not limited to infection/ bleeding / damage to bowel bladder ureters as well as conversion to laparotomy with the need for further surgery. pt voiced understanding and would like to proceed with robotic assisted laparoscopic hysterectomy with bilateral salpingectomy.. r/o trasnfusion discussed. HIV/ HEP B and C.. she is  advised to avoid NSAIDS between now and surgery. she is advised not to eat or drink after midninght the night prior to surgery.Marland Kitchen she will not be able to drive for 1 week after surgery. she will need to avoid heavy lifting over 10 lbs and sex for at least 6 weeks after surgery.

## 2022-06-10 ENCOUNTER — Other Ambulatory Visit: Payer: Self-pay

## 2022-06-10 ENCOUNTER — Encounter (HOSPITAL_BASED_OUTPATIENT_CLINIC_OR_DEPARTMENT_OTHER): Payer: Self-pay | Admitting: Obstetrics and Gynecology

## 2022-06-10 ENCOUNTER — Encounter (HOSPITAL_BASED_OUTPATIENT_CLINIC_OR_DEPARTMENT_OTHER)
Admission: RE | Disposition: A | Discharge status: Home / Self Care | Payer: Self-pay | Source: Ambulatory Visit | Attending: Obstetrics and Gynecology

## 2022-06-10 ENCOUNTER — Observation Stay (HOSPITAL_BASED_OUTPATIENT_CLINIC_OR_DEPARTMENT_OTHER): Admitting: Anesthesiology

## 2022-06-10 ENCOUNTER — Observation Stay (HOSPITAL_BASED_OUTPATIENT_CLINIC_OR_DEPARTMENT_OTHER)
Admission: RE | Admit: 2022-06-10 | Discharge: 2022-06-10 | Disposition: A | Discharge status: Home / Self Care | Source: Ambulatory Visit | Attending: Obstetrics and Gynecology | Admitting: Obstetrics and Gynecology

## 2022-06-10 DIAGNOSIS — Z87891 Personal history of nicotine dependence: Secondary | ICD-10-CM | POA: Insufficient documentation

## 2022-06-10 DIAGNOSIS — D282 Benign neoplasm of uterine tubes and ligaments: Secondary | ICD-10-CM | POA: Insufficient documentation

## 2022-06-10 DIAGNOSIS — D259 Leiomyoma of uterus, unspecified: Secondary | ICD-10-CM | POA: Diagnosis not present

## 2022-06-10 DIAGNOSIS — N939 Abnormal uterine and vaginal bleeding, unspecified: Secondary | ICD-10-CM | POA: Diagnosis not present

## 2022-06-10 DIAGNOSIS — D251 Intramural leiomyoma of uterus: Secondary | ICD-10-CM | POA: Diagnosis not present

## 2022-06-10 DIAGNOSIS — Z90710 Acquired absence of both cervix and uterus: Secondary | ICD-10-CM | POA: Diagnosis present

## 2022-06-10 DIAGNOSIS — D260 Other benign neoplasm of cervix uteri: Secondary | ICD-10-CM | POA: Insufficient documentation

## 2022-06-10 DIAGNOSIS — D219 Benign neoplasm of connective and other soft tissue, unspecified: Secondary | ICD-10-CM | POA: Insufficient documentation

## 2022-06-10 DIAGNOSIS — Z01818 Encounter for other preprocedural examination: Secondary | ICD-10-CM

## 2022-06-10 DIAGNOSIS — Z79899 Other long term (current) drug therapy: Secondary | ICD-10-CM | POA: Diagnosis not present

## 2022-06-10 DIAGNOSIS — N8003 Adenomyosis of the uterus: Secondary | ICD-10-CM | POA: Diagnosis not present

## 2022-06-10 HISTORY — DX: Anemia, unspecified: D64.9

## 2022-06-10 HISTORY — PX: CYSTOSCOPY: SHX5120

## 2022-06-10 HISTORY — DX: Essential (primary) hypertension: I10

## 2022-06-10 HISTORY — PX: ROBOTIC ASSISTED LAPAROSCOPIC HYSTERECTOMY AND SALPINGECTOMY: SHX6379

## 2022-06-10 LAB — TYPE AND SCREEN
ABO/RH(D): O POS
Antibody Screen: NEGATIVE

## 2022-06-10 LAB — POCT PREGNANCY, URINE: Preg Test, Ur: NEGATIVE

## 2022-06-10 LAB — ABO/RH: ABO/RH(D): O POS

## 2022-06-10 SURGERY — XI ROBOTIC ASSISTED LAPAROSCOPIC HYSTERECTOMY AND SALPINGECTOMY
Anesthesia: General | Site: Bladder

## 2022-06-10 MED ORDER — FENTANYL CITRATE (PF) 100 MCG/2ML IJ SOLN
INTRAMUSCULAR | Status: DC | PRN
  Administered 2022-06-10: 50 ug via INTRAVENOUS
  Administered 2022-06-10: 100 ug via INTRAVENOUS
  Administered 2022-06-10: 50 ug via INTRAVENOUS

## 2022-06-10 MED ORDER — TEMAZEPAM 15 MG PO CAPS
15.0000 mg | ORAL_CAPSULE | Freq: Every evening | ORAL | Status: DC | PRN
  Filled 2022-06-10 (×2): qty 2

## 2022-06-10 MED ORDER — SUGAMMADEX SODIUM 200 MG/2ML IV SOLN
INTRAVENOUS | Status: DC | PRN
  Administered 2022-06-10: 200 mg via INTRAVENOUS

## 2022-06-10 MED ORDER — SODIUM CHLORIDE 0.9 % IR SOLN
Status: DC | PRN
  Administered 2022-06-10: 450 mL

## 2022-06-10 MED ORDER — HYDROMORPHONE HCL 1 MG/ML IJ SOLN
INTRAMUSCULAR | Status: AC
  Filled 2022-06-10: qty 1

## 2022-06-10 MED ORDER — ALUM & MAG HYDROXIDE-SIMETH 200-200-20 MG/5ML PO SUSP: 30.0000 mL | ORAL | Status: DC | PRN

## 2022-06-10 MED ORDER — LIDOCAINE 2% (20 MG/ML) 5 ML SYRINGE
INTRAMUSCULAR | Status: DC | PRN
  Administered 2022-06-10: 100 mg via INTRAVENOUS

## 2022-06-10 MED ORDER — SENNA 8.6 MG PO TABS
ORAL_TABLET | ORAL | Status: AC
  Filled 2022-06-10: qty 1

## 2022-06-10 MED ORDER — PROPOFOL 10 MG/ML IV BOLUS
INTRAVENOUS | Status: DC | PRN
  Administered 2022-06-10: 160 mg via INTRAVENOUS
  Administered 2022-06-10: 40 mg via INTRAVENOUS

## 2022-06-10 MED ORDER — SODIUM CHLORIDE 0.9 % IV SOLN
2.0000 g | INTRAVENOUS | Status: AC
  Administered 2022-06-10: 2 g via INTRAVENOUS

## 2022-06-10 MED ORDER — MIDAZOLAM HCL 2 MG/2ML IJ SOLN
INTRAMUSCULAR | Status: AC
  Filled 2022-06-10: qty 2

## 2022-06-10 MED ORDER — ROCURONIUM BROMIDE 10 MG/ML (PF) SYRINGE
PREFILLED_SYRINGE | INTRAVENOUS | Status: AC
  Filled 2022-06-10: qty 10

## 2022-06-10 MED ORDER — SIMETHICONE 80 MG PO CHEW
CHEWABLE_TABLET | ORAL | Status: AC
  Filled 2022-06-10: qty 1

## 2022-06-10 MED ORDER — SIMETHICONE 80 MG PO CHEW
80.0000 mg | CHEWABLE_TABLET | Freq: Four times a day (QID) | ORAL | Status: DC | PRN
  Administered 2022-06-10: 80 mg via ORAL

## 2022-06-10 MED ORDER — PHENYLEPHRINE HCL (PRESSORS) 10 MG/ML IV SOLN
INTRAVENOUS | Status: AC
  Filled 2022-06-10: qty 1

## 2022-06-10 MED ORDER — ROCURONIUM BROMIDE 10 MG/ML (PF) SYRINGE
PREFILLED_SYRINGE | INTRAVENOUS | Status: DC | PRN
  Administered 2022-06-10: 80 mg via INTRAVENOUS

## 2022-06-10 MED ORDER — DIPHENHYDRAMINE HCL 50 MG/ML IJ SOLN
INTRAMUSCULAR | Status: DC | PRN
  Administered 2022-06-10: 12.5 mg via INTRAVENOUS

## 2022-06-10 MED ORDER — DEXAMETHASONE SODIUM PHOSPHATE 10 MG/ML IJ SOLN
INTRAMUSCULAR | Status: DC | PRN
  Administered 2022-06-10: 10 mg via INTRAVENOUS

## 2022-06-10 MED ORDER — MIDAZOLAM HCL 2 MG/2ML IJ SOLN
INTRAMUSCULAR | Status: DC | PRN
  Administered 2022-06-10: 2 mg via INTRAVENOUS

## 2022-06-10 MED ORDER — ALBUTEROL SULFATE HFA 108 (90 BASE) MCG/ACT IN AERS: 1.0000 | INHALATION_SPRAY | Freq: Four times a day (QID) | RESPIRATORY_TRACT | Status: DC | PRN

## 2022-06-10 MED ORDER — ACETAMINOPHEN 500 MG PO TABS: 1000.0000 mg | ORAL_TABLET | Freq: Three times a day (TID) | ORAL | 1 refills | Status: AC | PRN

## 2022-06-10 MED ORDER — MENTHOL 3 MG MT LOZG: 1.0000 | LOZENGE | OROMUCOSAL | Status: DC | PRN

## 2022-06-10 MED ORDER — SENNA 8.6 MG PO TABS
1.0000 | ORAL_TABLET | Freq: Two times a day (BID) | ORAL | Status: DC
  Administered 2022-06-10: 8.6 mg via ORAL

## 2022-06-10 MED ORDER — LIDOCAINE HCL (PF) 2 % IJ SOLN
INTRAMUSCULAR | Status: AC
  Filled 2022-06-10: qty 5

## 2022-06-10 MED ORDER — KETOROLAC TROMETHAMINE 30 MG/ML IJ SOLN
30.0000 mg | Freq: Once | INTRAMUSCULAR | Status: AC
  Administered 2022-06-10: 30 mg via INTRAVENOUS

## 2022-06-10 MED ORDER — EPHEDRINE SULFATE (PRESSORS) 50 MG/ML IJ SOLN
INTRAMUSCULAR | Status: DC | PRN
  Administered 2022-06-10: 5 mg via INTRAVENOUS

## 2022-06-10 MED ORDER — STERILE WATER FOR IRRIGATION IR SOLN
Status: DC | PRN
  Administered 2022-06-10: 500 mL

## 2022-06-10 MED ORDER — FENTANYL CITRATE (PF) 100 MCG/2ML IJ SOLN
25.0000 ug | INTRAMUSCULAR | Status: DC | PRN
  Administered 2022-06-10 (×3): 50 ug via INTRAVENOUS

## 2022-06-10 MED ORDER — SCOPOLAMINE 1 MG/3DAYS TD PT72
MEDICATED_PATCH | TRANSDERMAL | Status: AC
  Filled 2022-06-10: qty 1

## 2022-06-10 MED ORDER — OXYCODONE HCL 5 MG PO TABS
ORAL_TABLET | ORAL | Status: AC
  Filled 2022-06-10: qty 1

## 2022-06-10 MED ORDER — IBUPROFEN 200 MG PO TABS: 600.0000 mg | ORAL_TABLET | Freq: Four times a day (QID) | ORAL | Status: DC

## 2022-06-10 MED ORDER — FLUORESCEIN SODIUM 10 % IV SOLN
INTRAVENOUS | Status: DC | PRN
  Administered 2022-06-10: 10 mg via INTRAVENOUS

## 2022-06-10 MED ORDER — ARTIFICIAL TEARS OPHTHALMIC OINT
TOPICAL_OINTMENT | OPHTHALMIC | Status: AC
  Filled 2022-06-10: qty 3.5

## 2022-06-10 MED ORDER — ACETAMINOPHEN 500 MG PO TABS
ORAL_TABLET | ORAL | Status: AC
  Filled 2022-06-10: qty 2

## 2022-06-10 MED ORDER — FENTANYL CITRATE (PF) 100 MCG/2ML IJ SOLN
INTRAMUSCULAR | Status: AC
  Filled 2022-06-10: qty 2

## 2022-06-10 MED ORDER — GABAPENTIN 300 MG PO CAPS
ORAL_CAPSULE | ORAL | Status: AC
  Filled 2022-06-10: qty 1

## 2022-06-10 MED ORDER — PROPOFOL 500 MG/50ML IV EMUL
INTRAVENOUS | Status: AC
  Filled 2022-06-10: qty 50

## 2022-06-10 MED ORDER — SCOPOLAMINE 1 MG/3DAYS TD PT72
1.0000 | MEDICATED_PATCH | TRANSDERMAL | Status: DC
  Administered 2022-06-10: 1.5 mg via TRANSDERMAL

## 2022-06-10 MED ORDER — FENTANYL CITRATE (PF) 250 MCG/5ML IJ SOLN
INTRAMUSCULAR | Status: AC
  Filled 2022-06-10: qty 5

## 2022-06-10 MED ORDER — PROPOFOL 500 MG/50ML IV EMUL
INTRAVENOUS | Status: DC | PRN
  Administered 2022-06-10: 25 ug/kg/min via INTRAVENOUS

## 2022-06-10 MED ORDER — DIPHENHYDRAMINE HCL 50 MG/ML IJ SOLN
INTRAMUSCULAR | Status: AC
  Filled 2022-06-10: qty 1

## 2022-06-10 MED ORDER — AMISULPRIDE (ANTIEMETIC) 5 MG/2ML IV SOLN: 10.0000 mg | Freq: Once | INTRAVENOUS | Status: DC | PRN

## 2022-06-10 MED ORDER — PROPOFOL 10 MG/ML IV BOLUS
INTRAVENOUS | Status: AC
  Filled 2022-06-10: qty 20

## 2022-06-10 MED ORDER — ACETAMINOPHEN 500 MG PO TABS
1000.0000 mg | ORAL_TABLET | ORAL | Status: AC
  Administered 2022-06-10: 1000 mg via ORAL

## 2022-06-10 MED ORDER — OXYCODONE HCL 5 MG PO TABS
5.0000 mg | ORAL_TABLET | ORAL | Status: DC | PRN
  Administered 2022-06-10 (×3): 5 mg via ORAL

## 2022-06-10 MED ORDER — HYDROMORPHONE HCL 2 MG/ML IJ SOLN
INTRAMUSCULAR | Status: AC
  Filled 2022-06-10: qty 1

## 2022-06-10 MED ORDER — HYDROMORPHONE HCL 1 MG/ML IJ SOLN
INTRAMUSCULAR | Status: DC | PRN
  Administered 2022-06-10: .5 mg via INTRAVENOUS

## 2022-06-10 MED ORDER — PROMETHAZINE HCL 25 MG/ML IJ SOLN: 6.2500 mg | INTRAMUSCULAR | Status: DC | PRN

## 2022-06-10 MED ORDER — SODIUM CHLORIDE 0.9 % IV SOLN
INTRAVENOUS | Status: AC
  Filled 2022-06-10: qty 2

## 2022-06-10 MED ORDER — ACETAMINOPHEN 325 MG PO TABS
650.0000 mg | ORAL_TABLET | ORAL | Status: DC | PRN
  Administered 2022-06-10: 650 mg via ORAL

## 2022-06-10 MED ORDER — DEXAMETHASONE SODIUM PHOSPHATE 10 MG/ML IJ SOLN
INTRAMUSCULAR | Status: AC
  Filled 2022-06-10: qty 1

## 2022-06-10 MED ORDER — LACTATED RINGERS IV SOLN: INTRAVENOUS | Status: DC

## 2022-06-10 MED ORDER — GABAPENTIN 300 MG PO CAPS
300.0000 mg | ORAL_CAPSULE | ORAL | Status: AC
  Administered 2022-06-10: 300 mg via ORAL

## 2022-06-10 MED ORDER — OXYCODONE HCL 5 MG PO TABS: 5.0000 mg | ORAL_TABLET | Freq: Four times a day (QID) | ORAL | 0 refills | Status: AC | PRN

## 2022-06-10 MED ORDER — PANTOPRAZOLE SODIUM 40 MG PO TBEC: 40.0000 mg | DELAYED_RELEASE_TABLET | Freq: Every day | ORAL | Status: DC

## 2022-06-10 MED ORDER — KETOROLAC TROMETHAMINE 30 MG/ML IJ SOLN
INTRAMUSCULAR | Status: AC
  Filled 2022-06-10: qty 1

## 2022-06-10 MED ORDER — KETOROLAC TROMETHAMINE 30 MG/ML IJ SOLN
INTRAMUSCULAR | Status: DC | PRN
  Administered 2022-06-10: 30 mg via INTRAVENOUS

## 2022-06-10 MED ORDER — ONDANSETRON HCL 4 MG PO TABS: 4.0000 mg | ORAL_TABLET | Freq: Four times a day (QID) | ORAL | Status: DC | PRN

## 2022-06-10 MED ORDER — ONDANSETRON HCL 4 MG/2ML IJ SOLN: 4.0000 mg | Freq: Four times a day (QID) | INTRAMUSCULAR | Status: DC | PRN

## 2022-06-10 MED ORDER — POVIDONE-IODINE 10 % EX SWAB: 2.0000 | Freq: Once | CUTANEOUS | Status: DC

## 2022-06-10 MED ORDER — VALACYCLOVIR HCL 500 MG PO TABS
1000.0000 mg | ORAL_TABLET | Freq: Every day | ORAL | Status: DC
  Filled 2022-06-10: qty 2

## 2022-06-10 MED ORDER — IBUPROFEN 600 MG PO TABS: 600.0000 mg | ORAL_TABLET | Freq: Four times a day (QID) | ORAL | 1 refills | Status: DC | PRN

## 2022-06-10 MED ORDER — HYDROMORPHONE HCL 1 MG/ML IJ SOLN
0.2000 mg | INTRAMUSCULAR | Status: DC | PRN
  Administered 2022-06-10: 0.6 mg via INTRAVENOUS
  Administered 2022-06-10: 0.4 mg via INTRAVENOUS

## 2022-06-10 MED ORDER — SODIUM CHLORIDE 0.9 % IV SOLN
INTRAVENOUS | Status: DC | PRN
  Administered 2022-06-10: 90 mL

## 2022-06-10 MED ORDER — ACETAMINOPHEN 325 MG PO TABS
ORAL_TABLET | ORAL | Status: AC
  Filled 2022-06-10: qty 2

## 2022-06-10 MED ORDER — SERTRALINE HCL 100 MG PO TABS
100.0000 mg | ORAL_TABLET | Freq: Every day | ORAL | Status: DC
  Filled 2022-06-10: qty 1

## 2022-06-10 MED ORDER — ONDANSETRON HCL 4 MG/2ML IJ SOLN
INTRAMUSCULAR | Status: DC | PRN
  Administered 2022-06-10: 4 mg via INTRAVENOUS

## 2022-06-10 MED ORDER — SODIUM CHLORIDE 0.9 % IR SOLN
Status: DC | PRN
  Administered 2022-06-10: 400 mL

## 2022-06-10 MED ORDER — HEMOSTATIC AGENTS (NO CHARGE) OPTIME
TOPICAL | Status: DC | PRN
  Administered 2022-06-10: 1

## 2022-06-10 SURGICAL SUPPLY — 79 items
ADH SKN CLS APL DERMABOND .7 (GAUZE/BANDAGES/DRESSINGS) ×2
APL SRG 38 LTWT LNG FL B (MISCELLANEOUS) ×2
APPLICATOR ARISTA FLEXITIP XL (MISCELLANEOUS) IMPLANT
BAG DECANTER FOR FLEXI CONT (MISCELLANEOUS) IMPLANT
BARRIER ADHS 3X4 INTERCEED (GAUZE/BANDAGES/DRESSINGS) IMPLANT
BRR ADH 4X3 ABS CNTRL BYND (GAUZE/BANDAGES/DRESSINGS)
CATH FOLEY 3WAY  5CC 16FR (CATHETERS) ×2
CATH FOLEY 3WAY 5CC 16FR (CATHETERS) ×2 IMPLANT
CELLS DAT CNTRL 66122 CELL SVR (MISCELLANEOUS) IMPLANT
COVER BACK TABLE 60X90IN (DRAPES) ×2 IMPLANT
COVER TIP SHEARS 8 DVNC (MISCELLANEOUS) ×2 IMPLANT
COVER TIP SHEARS 8MM DA VINCI (MISCELLANEOUS) ×2
DEFOGGER SCOPE WARMER CLEARIFY (MISCELLANEOUS) ×2 IMPLANT
DERMABOND ADVANCED .7 DNX12 (GAUZE/BANDAGES/DRESSINGS) ×2 IMPLANT
DILATOR CANAL MILEX (MISCELLANEOUS) ×2 IMPLANT
DRAPE ARM DVNC X/XI (DISPOSABLE) ×8 IMPLANT
DRAPE COLUMN DVNC XI (DISPOSABLE) ×2 IMPLANT
DRAPE DA VINCI XI ARM (DISPOSABLE) ×8
DRAPE DA VINCI XI COLUMN (DISPOSABLE) ×2
DRAPE UTILITY 15X26 TOWEL STRL (DRAPES) ×2 IMPLANT
DURAPREP 26ML APPLICATOR (WOUND CARE) ×2 IMPLANT
ELECT REM PT RETURN 9FT ADLT (ELECTROSURGICAL) ×2
ELECTRODE REM PT RTRN 9FT ADLT (ELECTROSURGICAL) ×2 IMPLANT
GAUZE 4X4 16PLY ~~LOC~~+RFID DBL (SPONGE) ×4 IMPLANT
GLOVE BIO SURGEON STRL SZ7 (GLOVE) IMPLANT
GLOVE BIOGEL M 6.5 STRL (GLOVE) ×6 IMPLANT
GLOVE BIOGEL M 7.0 STRL (GLOVE) ×6 IMPLANT
GLOVE BIOGEL PI IND STRL 6.5 (GLOVE) ×12 IMPLANT
GLOVE BIOGEL PI IND STRL 7.0 (GLOVE) ×12 IMPLANT
GOWN STRL REUS W/ TWL LRG LVL3 (GOWN DISPOSABLE) IMPLANT
GOWN STRL REUS W/TWL LRG LVL3 (GOWN DISPOSABLE) ×2
HEMOSTAT ARISTA ABSORB 3G PWDR (HEMOSTASIS) IMPLANT
HIBICLENS CHG 4% 4OZ BTL (MISCELLANEOUS) IMPLANT
HOLDER FOLEY CATH W/STRAP (MISCELLANEOUS) IMPLANT
IRRIG SUCT STRYKERFLOW 2 WTIP (MISCELLANEOUS) ×2
IRRIGATION SUCT STRKRFLW 2 WTP (MISCELLANEOUS) ×2 IMPLANT
IV NS 1000ML (IV SOLUTION) ×4
IV NS 1000ML BAXH (IV SOLUTION) IMPLANT
KIT TURNOVER CYSTO (KITS) ×2 IMPLANT
LEGGING LITHOTOMY PAIR STRL (DRAPES) ×2 IMPLANT
MANIFOLD NEPTUNE II (INSTRUMENTS) ×2 IMPLANT
OBTURATOR OPTICAL STANDARD 8MM (TROCAR) ×2
OBTURATOR OPTICAL STND 8 DVNC (TROCAR) ×2
OBTURATOR OPTICALSTD 8 DVNC (TROCAR) ×2 IMPLANT
OCCLUDER COLPOPNEUMO (BALLOONS) ×2 IMPLANT
PACK ROBOT WH (CUSTOM PROCEDURE TRAY) ×2 IMPLANT
PACK ROBOTIC GOWN (GOWN DISPOSABLE) ×2 IMPLANT
PACK TRENDGUARD 450 HYBRID PRO (MISCELLANEOUS) IMPLANT
PAD OB MATERNITY 4.3X12.25 (PERSONAL CARE ITEMS) ×2 IMPLANT
PAD PREP 24X48 CUFFED NSTRL (MISCELLANEOUS) ×2 IMPLANT
PROTECTOR NERVE ULNAR (MISCELLANEOUS) ×2 IMPLANT
RETRACTOR WND ALEXIS 18 MED (MISCELLANEOUS) IMPLANT
RTRCTR WOUND ALEXIS 18CM MED (MISCELLANEOUS)
RTRCTR WOUND ALEXIS 18CM SML (INSTRUMENTS)
SAVER CELL AAL HAEMONETICS (INSTRUMENTS) IMPLANT
SCISSORS LAP 5X45 EPIX DISP (ENDOMECHANICALS) IMPLANT
SEAL CANN UNIV 5-8 DVNC XI (MISCELLANEOUS) ×8 IMPLANT
SEAL XI 5MM-8MM UNIVERSAL (MISCELLANEOUS) ×8
SEALER VESSEL DA VINCI XI (MISCELLANEOUS) ×2
SEALER VESSEL EXT DVNC XI (MISCELLANEOUS) IMPLANT
SET IRRIG Y TYPE TUR BLADDER L (SET/KITS/TRAYS/PACK) IMPLANT
SET TRI-LUMEN FLTR TB AIRSEAL (TUBING) ×2 IMPLANT
SPIKE FLUID TRANSFER (MISCELLANEOUS) ×4 IMPLANT
SPONGE T-LAP 4X18 ~~LOC~~+RFID (SPONGE) ×2 IMPLANT
SUT VIC AB 0 CT1 27 (SUTURE) ×4
SUT VIC AB 0 CT1 27XBRD ANBCTR (SUTURE) ×4 IMPLANT
SUT VICRYL 0 UR6 27IN ABS (SUTURE) IMPLANT
SUT VICRYL RAPIDE 4/0 PS 2 (SUTURE) ×6 IMPLANT
SUT VLOC 180 0 9IN  GS21 (SUTURE) ×2
SUT VLOC 180 0 9IN GS21 (SUTURE) ×2 IMPLANT
TIP RUMI ORANGE 6.7MMX12CM (TIP) IMPLANT
TIP UTERINE 5.1X6CM LAV DISP (MISCELLANEOUS) IMPLANT
TIP UTERINE 6.7X10CM GRN DISP (MISCELLANEOUS) IMPLANT
TIP UTERINE 6.7X6CM WHT DISP (MISCELLANEOUS) IMPLANT
TIP UTERINE 6.7X8CM BLUE DISP (MISCELLANEOUS) IMPLANT
TOWEL OR 17X26 10 PK STRL BLUE (TOWEL DISPOSABLE) ×2 IMPLANT
TRENDGUARD 450 HYBRID PRO PACK (MISCELLANEOUS) ×2
TROCAR PORT AIRSEAL 8X120 (TROCAR) ×2 IMPLANT
WATER STERILE IRR 500ML POUR (IV SOLUTION) IMPLANT

## 2022-06-10 NOTE — Transfer of Care (Signed)
Immediate Anesthesia Transfer of Care Note  Patient: Bonnie Farmer  Procedure(s) Performed: XI ROBOTIC ASSISTED LAPAROSCOPIC HYSTERECTOMY AND BILATERAL SALPINGECTOMY (Bilateral: Abdomen) CYSTOSCOPY (Bladder)  Patient Location: PACU  Anesthesia Type:General  Level of Consciousness: awake, alert  and oriented  Airway & Oxygen Therapy: Patient Spontanous Breathing  Post-op Assessment: Report given to RN and Post -op Vital signs reviewed and stable  Post vital signs: Reviewed and stable  Last Vitals:  Vitals Value Taken Time  BP 144/89 06/10/22 1001  Temp    Pulse 81 06/10/22 1003  Resp 15 06/10/22 1003  SpO2 100 % 06/10/22 1003  Vitals shown include unvalidated device data.  Last Pain:  Vitals:   06/10/22 0607  TempSrc: Oral  PainSc: 7       Patients Stated Pain Goal: 3 (94/85/46 2703)  Complications: No notable events documented.

## 2022-06-10 NOTE — Anesthesia Procedure Notes (Signed)
Procedure Name: Intubation Date/Time: 06/10/2022 7:50 AM  Performed by: Mechele Claude, CRNAPre-anesthesia Checklist: Patient identified, Emergency Drugs available, Suction available and Patient being monitored Patient Re-evaluated:Patient Re-evaluated prior to induction Oxygen Delivery Method: Circle system utilized Preoxygenation: Pre-oxygenation with 100% oxygen Induction Type: IV induction Ventilation: Mask ventilation without difficulty Laryngoscope Size: Mac and 3 Tube type: Oral Tube size: 7.0 mm Number of attempts: 1 Airway Equipment and Method: Stylet and Oral airway Placement Confirmation: ETT inserted through vocal cords under direct vision, positive ETCO2 and breath sounds checked- equal and bilateral Secured at: 22 cm Tube secured with: Tape Dental Injury: Teeth and Oropharynx as per pre-operative assessment

## 2022-06-10 NOTE — Op Note (Signed)
06/10/2022  9:44 AM  PATIENT:  Bonnie Farmer  56 y.o. female  PRE-OPERATIVE DIAGNOSIS:  Abnormal Uterine Bleeding Fibroids Pelvic Pain  POST-OPERATIVE DIAGNOSIS:  Abnormal Uterine Bleeding Fibroids Pelvic Pain  PROCEDURE:  Procedure(s): XI ROBOTIC ASSISTED LAPAROSCOPIC HYSTERECTOMY AND BILATERAL SALPINGECTOMY (Bilateral) CYSTOSCOPY (N/A)  SURGEON:  Surgeon(s) and Role:    Christophe Louis, MD - Primary  PHYSICIAN ASSISTANT: None  ASSISTANTS: Gaylord Shih RNFA assisted due to complexity of the anatomy    ANESTHESIA:   general  EBL:  35 mL   BLOOD ADMINISTERED:none  DRAINS: Urinary Catheter (Foley)   LOCAL MEDICATIONS USED:  OTHER ropivacaine  SPECIMEN:  Source of Specimen:  uterus cervix and bilateral fallopian tubes   DISPOSITION OF SPECIMEN:  PATHOLOGY  COUNTS:  YES  TOURNIQUET:  * No tourniquets in log *  DICTATION: .Note written in EPIC  PLAN OF CARE: Admit for overnight observation  PATIENT DISPOSITION:  PACU - hemodynamically stable.   Delay start of Pharmacological VTE agent (>24 hrs) due to surgical blood loss or risk of bleeding: not applicable  Findings Enlarged fibroid uterus. Normal appearing fallopian tubes and ovaries   Procedure: The patient was taken to the operating room where she was placed under general anesthesia.Time out was performed. Marland Kitchen She was placed in dorsal lithotomy position and prepped and draped in the usual sterile fashion. A weighted speculum was placed into the vagina. A Deaver was placed anteriorly for retraction. The anterior lip of the cervix was grasped with a single-tooth tenaculum. The vaginal mucosa was injected with 2.5 cc of ropivacaine at the 2/4/ 8 and 10 o'clock positions. The uterus was sounded to 8 cm. the cervix was dilated to 6 mm . 0 vicryl suture placed at the 12 and 6:00 positions Of the cervix to facilitate placement of a Ru mi uterine manipulator. The manipulator was placed without difficulty. Weighted  speculum and Deaver were removed .  Attention was turned to the patient's abdomen where a 8 mm trocar was placed at  the umbilicus. under direct visualization . The pneumoperitoneum was achieved with PCO2 gas. The laparoscope was removed. 60 cc of ropivacaine were injected into the abdominal cavity. The laparoscope was reinserted. An 8 mm trocar was placed in the right upper quadrant 12 centimeters from the umbilicus.later connected to robotic arm #3). An 8 mm incision was made in the left upper quadrant 16 cm from the umbilicus and connected to robot arm #1. Marland Kitchen Attention was turned to the left upper quadrant where a 8 mm midclavicular assistant trocar was placed. ( All incision sites were injected with 10 cc of ropivacaine prior to port placement. )  Once all ports had been placed under direct visualization.The laparoscope was removed and the [place] robotic system was thin right-sided docked. The robotic arms were connected to the corresponding trocars as listed above. The laparoscope was then reinserted. The long tip bipolar forceps were placed into port #1.  A vessel sealer ( alternating with scissors) was placed in port #3. All instruments were directed into the pelvis under direct visualization.  Attention was turned to the surgeons console.. The left mesosalpinx and left utero-ovarian ligament was cauterized and transected with the vessel sealer The broad ligament was cauterized and transected with the vessel sealer .The round ligament was cauterized and transected with the vessel sealer  The anterior leaf of broad ligament was incised along the bladder reflection to the midline.  The right  mesosalpinx and right utero-ovarian ligament was cauterized  and transected with the vessel sealer. The right broad ligament was cauterized and transected with the vessel sealer. The right round ligament was cauterized and transected with the vessel sealer The broad ligament was incised to the midline. The bladder was  dissected off the lower uterine segments of the cervix via sharp and blunt dissection.   The uterine arteries were skeleton bilaterally. They were  cauterized and transected with the vessel sealer The KOH ring was identified. The anterior colpotomy was performed followed by the posterior colpotomy. Once the uterus,cervix and bilateral fallopian tubes were completely excised was removed through the vagina. The  bipolar forceps and scissors were removed and cobra forceps were placed in the port #1 and the mega needle driver was placed in to port #3.  The vaginal cuff was closed with running suture if 0 v-lock. The pelvis was irrigated. Marland KitchenMarland KitchenMarland KitchenExcellent hemostasis was noted. Arista was placed along the vaginal cuff.  All pelvic pedicles were examined and hemostasis was noted.  All instruments removed from the ports. All ports were removed under direct Visualization. The pneumoperitoneum was released. The skin incisions were closed with 4-0 Vicryl and then covered with Derma bond.   Attention was turned to the vagina which was inspected and no lacerations were noted. The vaginal occluder was removed.   Cystoscopy was performed using 30 degree scope. Both ureteral orifices were noted to express fluorescein. The cystoscope was removed     Sponge lap and needle counts were correct x.2 The patient was awakened from anesthesia and taken to the recovery room in stable condition.

## 2022-06-10 NOTE — H&P (Signed)
Date of Initial H&P: 06/09/2022  History reviewed, patient examined, no change in status, stable for surgery.

## 2022-06-10 NOTE — Discharge Summary (Signed)
Physician Discharge Summary  Patient ID: Bonnie Farmer MRN: [id number] DOB/AGE: March 20, 1966 56 y.o.  Admit date: 06/10/2022 Discharge date: 06/10/2022  Admission Diagnoses:Fibroids/ Pelvic pain/ abnormal uterine bleeding   Discharge Diagnoses:  Principal Problem:   Fibroids Active Problems:   S/P hysterectomy   Discharged Condition: stable  Hospital Course: pt was admitted for observation after undergoing a robotic assisted laparoscopic hysterectomy with bilateral salpingectomy. She did well postoperatively with return of bowel and bladder function. She is tolerating po. Pain is controlled with oral medications. She is discharged on pod #0.   Consults: None  Significant Diagnostic Studies: None  Treatments: surgery: robotic assisted laparoscopic hysterectomy with bilateral salpingectomy   Discharge Exam: Blood pressure (!) 135/95, pulse 73, temperature 98 F (36.7 C), resp. rate 16, height '5\' 5"'$  (1.651 m), weight 74.8 kg, last menstrual period 06/03/2022, SpO2 100 %. General appearance: alert, cooperative, and no distress Resp: no respiratory distress  GI: soft appropriately tender nondistended +BS Extremities: extremities normal, atraumatic, no cyanosis or edema Incision/Wound:well approximated no erythema or exudate   Disposition: Discharge disposition: 01-Home or Self Care       Discharge Instructions     Call MD for:  persistant nausea and vomiting   Complete by: As directed    Call MD for:  redness, tenderness, or signs of infection (pain, swelling, redness, odor or green/yellow discharge around incision site)   Complete by: As directed    Call MD for:  severe uncontrolled pain   Complete by: As directed    Call MD for:  temperature >100.4   Complete by: As directed    Diet general   Complete by: As directed    Driving Restrictions   Complete by: As directed    Avoid driving for 1 week   Increase activity slowly   Complete by: As directed     May shower / Bathe   Complete by: As directed    May walk up steps   Complete by: As directed    No wound care   Complete by: As directed    Sexual Activity Restrictions   Complete by: As directed    Avoid sex for 6 weeks and until approved by Dr. Landry Mellow      Allergies as of 06/10/2022       Reactions   Peanuts [peanut Oil] Anaphylaxis   Soy Allergy Anaphylaxis   Penicillins    Yeast infection   Sulfa Antibiotics Rash        Medication List     TAKE these medications    acetaminophen 500 MG tablet Commonly known as: TYLENOL Take 2 tablets (1,000 mg total) by mouth every 8 (eight) hours as needed for mild pain or moderate pain (temperature > 101.5.).   albuterol 108 (90 Base) MCG/ACT inhaler Commonly known as: VENTOLIN HFA Inhale 1-2 puffs into the lungs every 6 (six) hours as needed for wheezing or shortness of breath.   amLODipine 10 MG tablet Commonly known as: NORVASC Take 1 tablet (10 mg total) by mouth daily.   BLACK COHOSH EXTRACT PO Take by mouth daily.   EPINEPHrine 0.3 mg/0.3 mL Soaj injection Commonly known as: EPI-PEN Inject 0.3 mLs into the muscle once as needed for anaphylaxis.   fexofenadine 180 MG tablet Commonly known as: ALLEGRA Take 180 mg by mouth daily.   fluticasone 50 MCG/ACT nasal spray Commonly known as: FLONASE Place 1 spray into both nostrils daily.   gabapentin 300 MG capsule Commonly known as: NEURONTIN Take 1  capsule (300 mg total) by mouth at bedtime.   ibuprofen 600 MG tablet Commonly known as: ADVIL Take 1 tablet (600 mg total) by mouth every 6 (six) hours as needed.   leflunomide 20 MG tablet Commonly known as: ARAVA Take 1 tablet (20 mg total) by mouth daily. What changed: when to take this   omeprazole 20 MG capsule Commonly known as: PRILOSEC Take 2 capsules by mouth daily.   oxyCODONE 5 MG immediate release tablet Commonly known as: Oxy IR/ROXICODONE Take 1-2 tablets (5-10 mg total) by mouth every 6 (six)  hours as needed for moderate pain.   sertraline 100 MG tablet Commonly known as: ZOLOFT Take 1 tablet (100 mg total) by mouth daily.   traZODone 100 MG tablet Commonly known as: DESYREL Take 1-2 tablets (100-200 mg total) by mouth at bedtime. DO NOT TAKE MORE THAN 2 TABLETS DAILY   valACYclovir 1000 MG tablet Commonly known as: VALTREX Take 1 tablet (1,000 mg total) by mouth daily.        Follow-up Information     Christophe Louis, MD. Go in 2 week(s).   Specialty: Obstetrics and Gynecology Contact information: [address] [place] [address] [phone]                 Signed: Christophe Louis 06/10/2022, 5:36 PM

## 2022-06-10 NOTE — Anesthesia Postprocedure Evaluation (Signed)
Anesthesia Post Note  Patient: Teresa Nicodemus  Procedure(s) Performed: XI ROBOTIC ASSISTED LAPAROSCOPIC HYSTERECTOMY AND BILATERAL SALPINGECTOMY (Bilateral: Abdomen) CYSTOSCOPY (Bladder)     Patient location during evaluation: PACU Anesthesia Type: General Level of consciousness: sedated Pain management: pain level controlled Vital Signs Assessment: post-procedure vital signs reviewed and stable Respiratory status: spontaneous breathing and respiratory function stable Cardiovascular status: stable Postop Assessment: no apparent nausea or vomiting Anesthetic complications: no   No notable events documented.  Last Vitals:  Vitals:   06/10/22 1111 06/10/22 1132  BP: (!) 132/94 138/87  Pulse: 68 78  Resp: 16 16  Temp: 36.4 C (!) 36.3 C  SpO2: 100% 98%    Last Pain:  Vitals:   06/10/22 1111  TempSrc:   PainSc: 7                  Colena Ketterman DANIEL

## 2022-06-11 ENCOUNTER — Encounter (HOSPITAL_BASED_OUTPATIENT_CLINIC_OR_DEPARTMENT_OTHER): Payer: Self-pay | Admitting: Obstetrics and Gynecology

## 2022-06-11 LAB — SURGICAL PATHOLOGY

## 2022-06-11 IMAGING — DX DG CHEST 2V
2 series · 2 of 2 positions shown · non-contrast
Comparison: Radiograph 06/08/2013

CLINICAL DATA: Order dysnpea and sobdyspnea with exertion

EXAM:
CHEST - 2 VIEW

[chest pa]
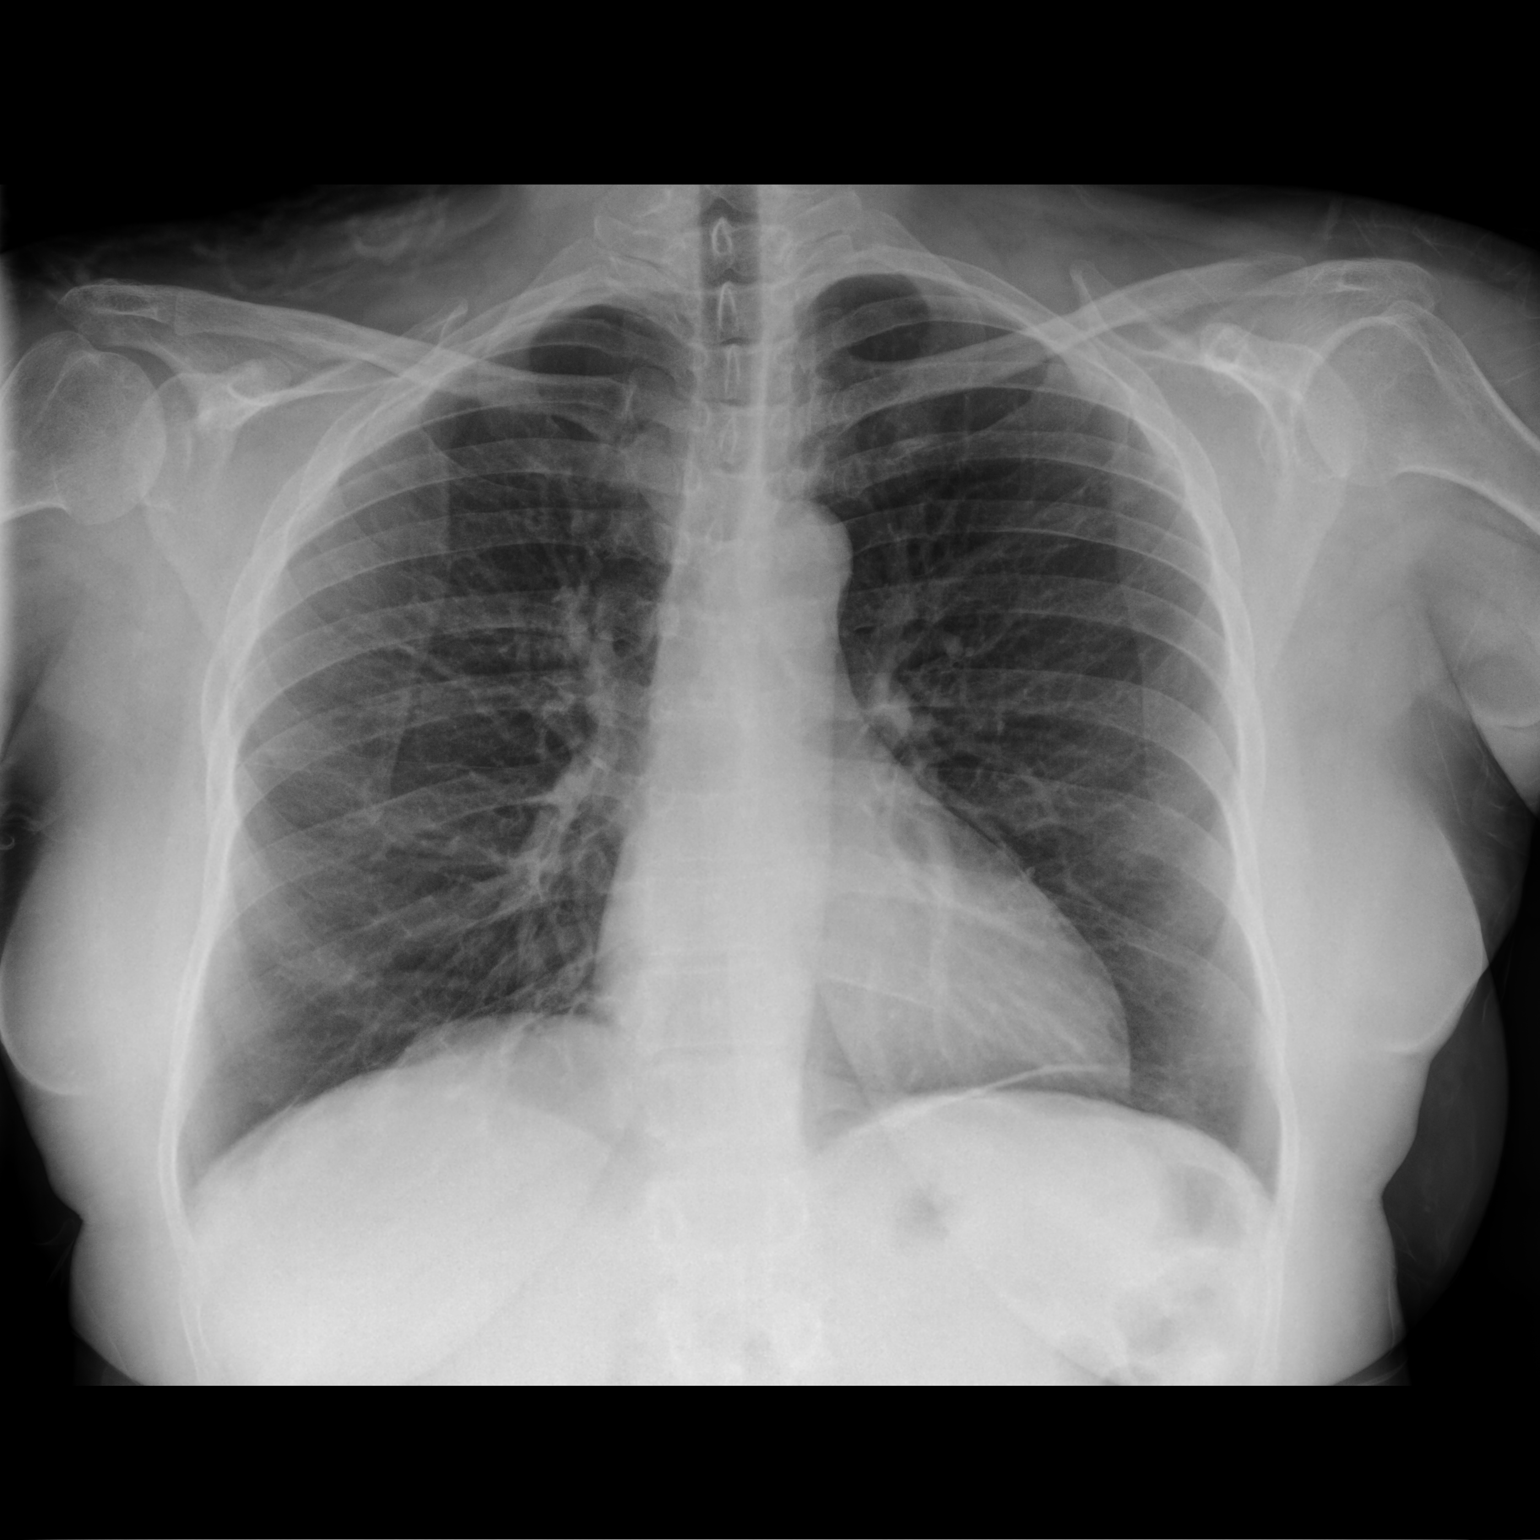

[chest lat]
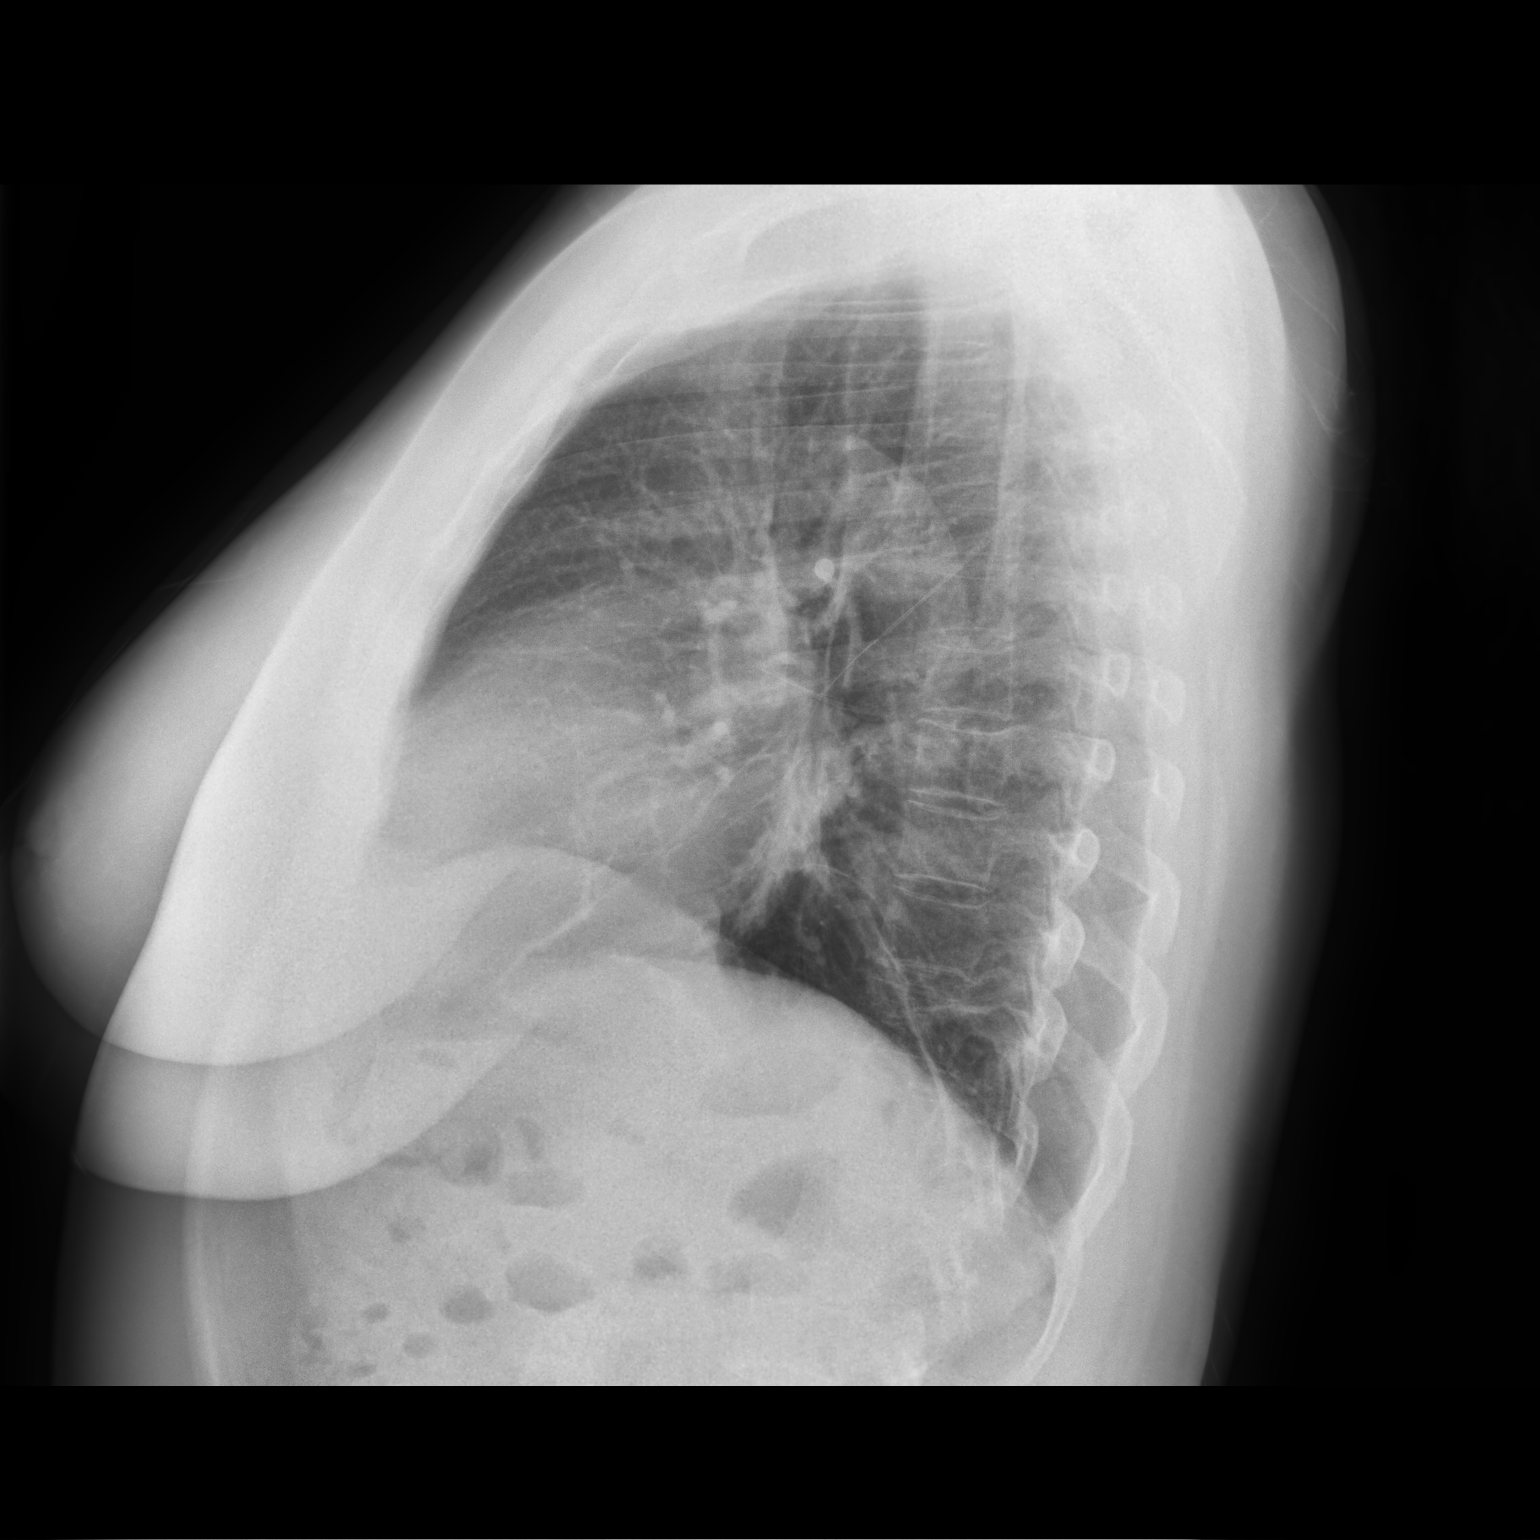

[2 of 2 positions shown; findings below may reference images not displayed]

FINDINGS: Normal mediastinum and cardiac silhouette. Normal pulmonary
vasculature. No evidence of effusion, infiltrate, or pneumothorax.
No acute bony abnormality.
IMPRESSION: No acute cardiopulmonary process.

## 2022-07-29 ENCOUNTER — Other Ambulatory Visit: Payer: Self-pay | Admitting: Physician Assistant

## 2022-07-29 ENCOUNTER — Ambulatory Visit
Admission: RE | Admit: 2022-07-29 | Discharge: 2022-07-29 | Disposition: A | Discharge status: Home / Self Care | Source: Ambulatory Visit | Attending: Physician Assistant | Admitting: Physician Assistant

## 2022-07-29 DIAGNOSIS — S6991XA Unspecified injury of right wrist, hand and finger(s), initial encounter: Secondary | ICD-10-CM

## 2022-07-29 DIAGNOSIS — M533 Sacrococcygeal disorders, not elsewhere classified: Secondary | ICD-10-CM

## 2022-09-14 IMAGING — DX DG CHEST 2V
2 series · 2 of 2 positions shown · non-contrast
Comparison: February 17, 2020.

CLINICAL DATA: Essential hypertension.

EXAM:
CHEST - 2 VIEW

[dg chest 2 view (1 of 2)]
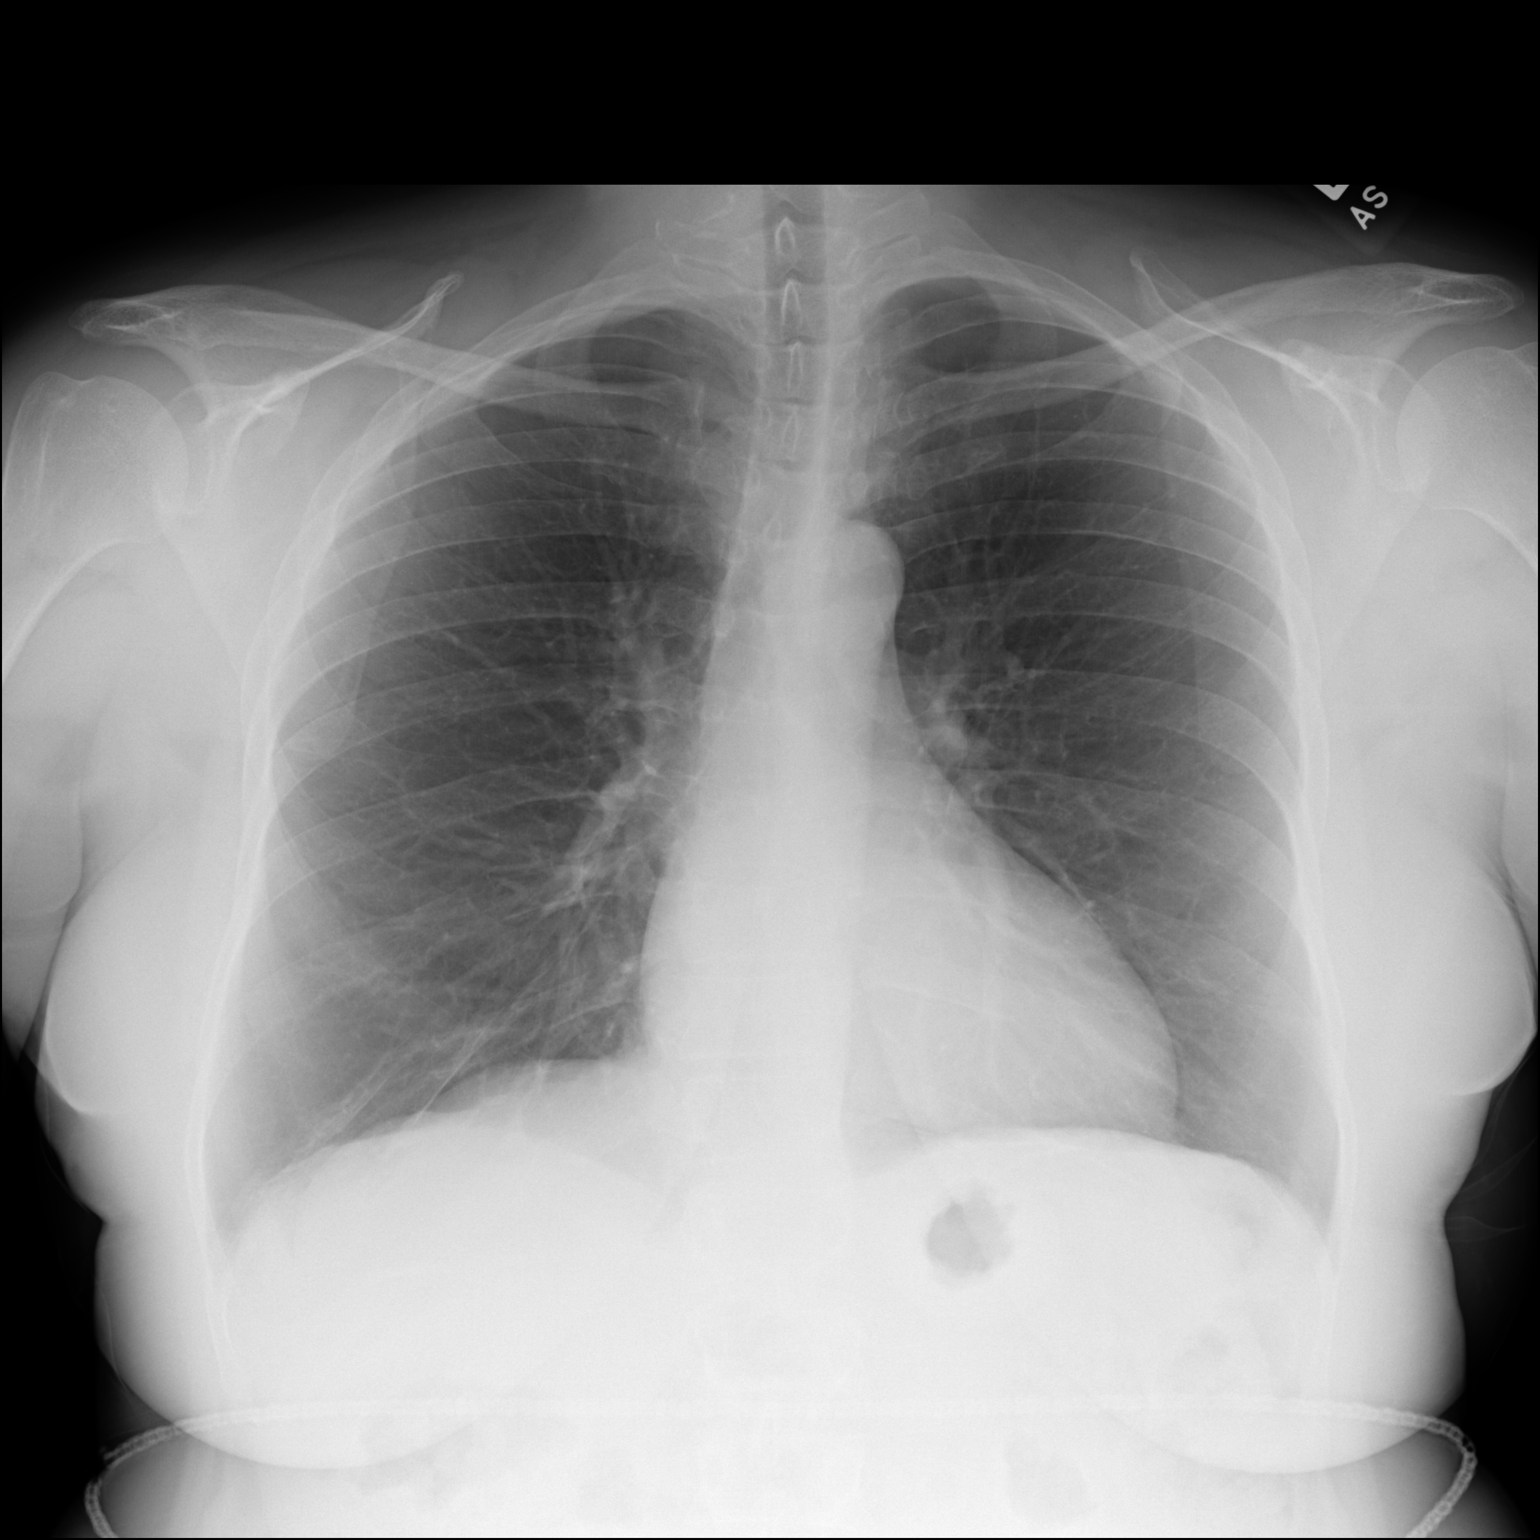

[dg chest 2 view (2 of 2)]
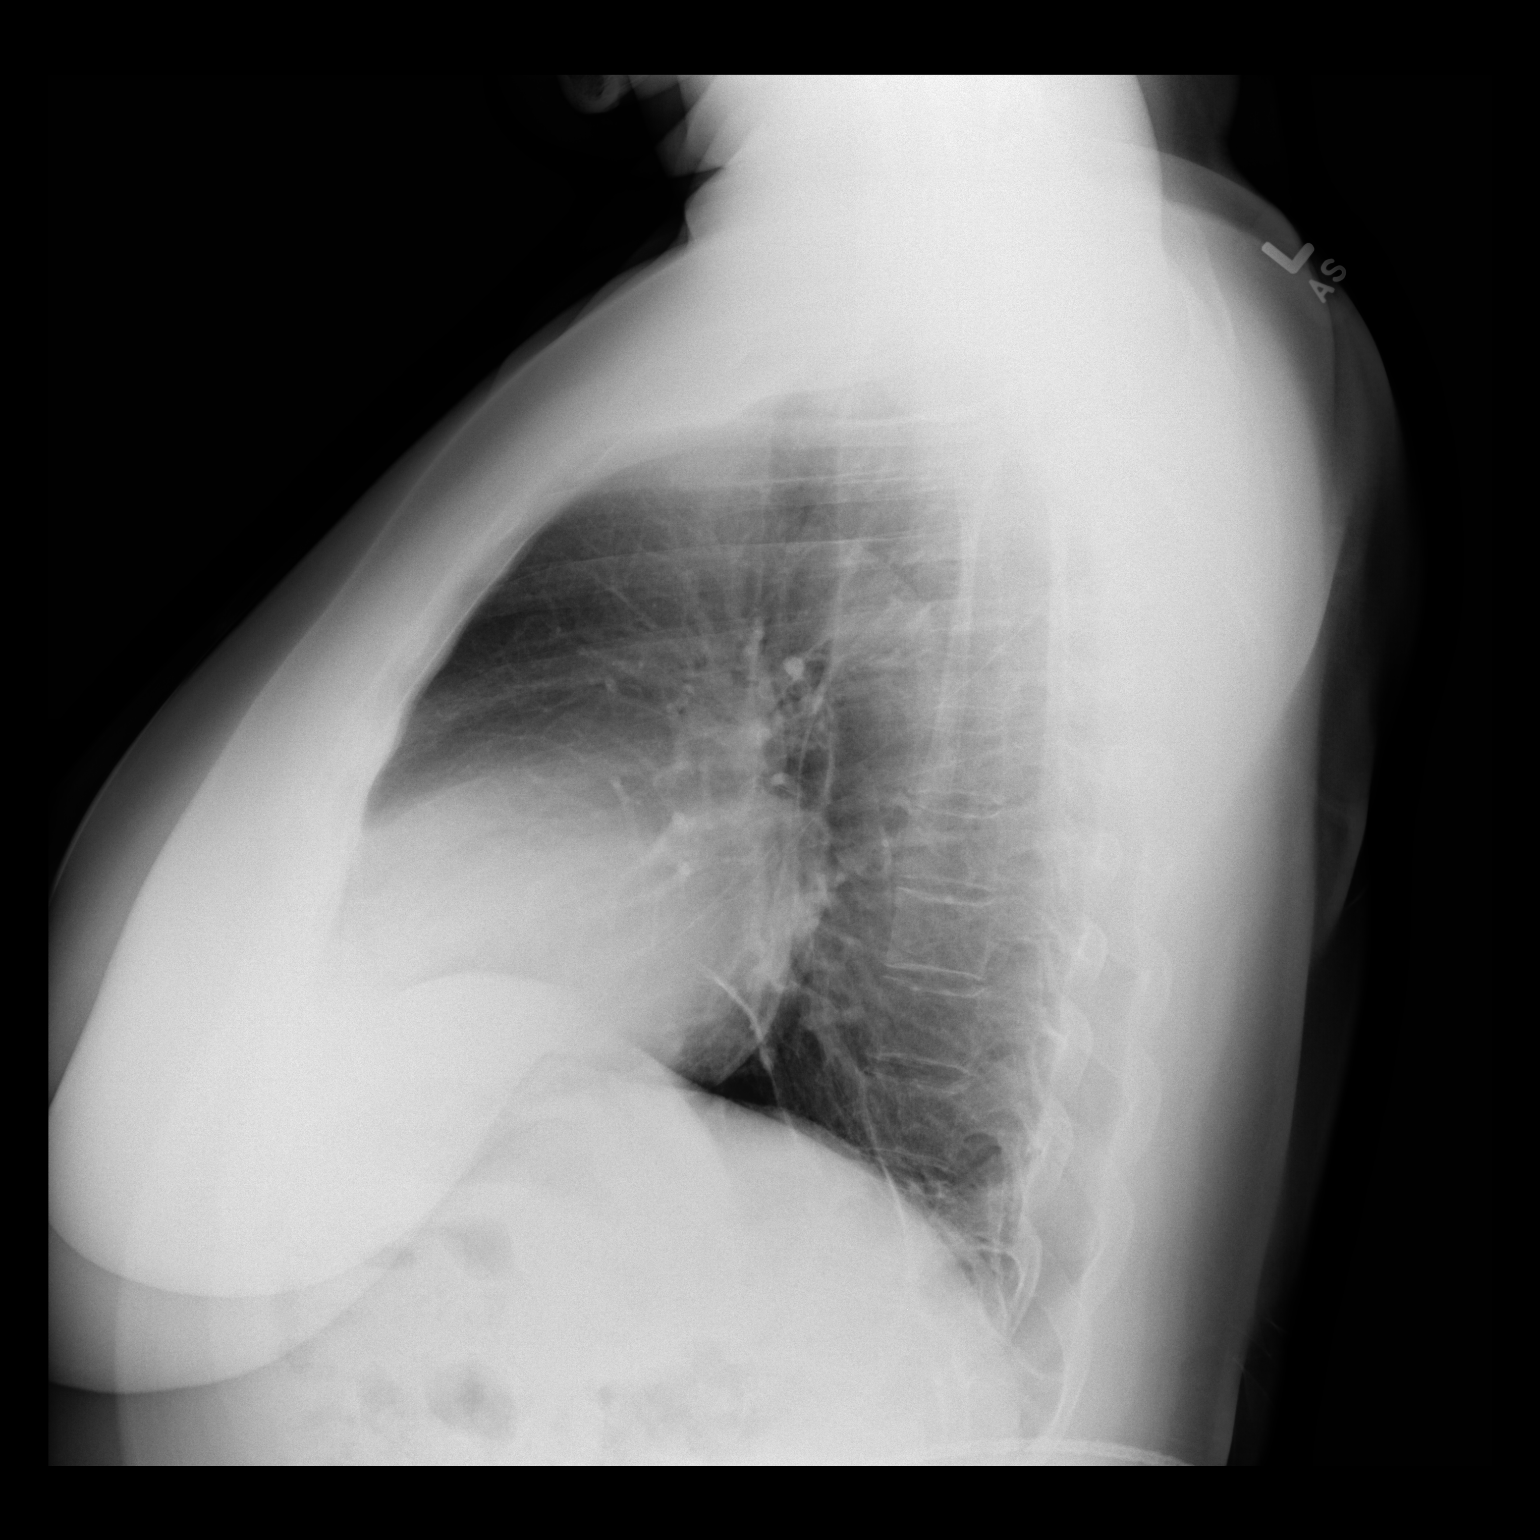

[2 of 2 positions shown; findings below may reference images not displayed]

FINDINGS: The heart size and mediastinal contours are within normal limits.
Both lungs are clear. The visualized skeletal structures are
unremarkable.
IMPRESSION: No active cardiopulmonary disease.

## 2023-05-14 ENCOUNTER — Emergency Department (HOSPITAL_COMMUNITY)

## 2023-05-14 ENCOUNTER — Emergency Department (HOSPITAL_COMMUNITY)
Admission: EM | Admit: 2023-05-14 | Discharge: 2023-05-14 | Disposition: A | Discharge status: Home / Self Care | Attending: Emergency Medicine | Admitting: Emergency Medicine

## 2023-05-14 DIAGNOSIS — M5432 Sciatica, left side: Secondary | ICD-10-CM | POA: Insufficient documentation

## 2023-05-14 DIAGNOSIS — M25552 Pain in left hip: Secondary | ICD-10-CM | POA: Diagnosis present

## 2023-05-14 MED ORDER — HYDROCODONE-ACETAMINOPHEN 5-325 MG PO TABS
2.0000 | ORAL_TABLET | Freq: Once | ORAL | Status: DC
  Filled 2023-05-14: qty 2

## 2023-05-14 MED ORDER — KETOROLAC TROMETHAMINE 30 MG/ML IJ SOLN
30.0000 mg | Freq: Once | INTRAMUSCULAR | Status: AC
  Administered 2023-05-14: 30 mg via INTRAMUSCULAR
  Filled 2023-05-14: qty 1

## 2023-05-14 MED ORDER — LIDOCAINE 5 % EX PTCH: 1.0000 | MEDICATED_PATCH | CUTANEOUS | 0 refills | Status: AC

## 2023-05-14 MED ORDER — PREDNISONE 10 MG PO TABS: ORAL_TABLET | ORAL | 0 refills | Status: AC

## 2023-05-14 MED ORDER — IBUPROFEN 600 MG PO TABS: 600.0000 mg | ORAL_TABLET | Freq: Four times a day (QID) | ORAL | 0 refills | Status: AC | PRN

## 2023-05-14 MED ORDER — HYDROMORPHONE HCL 1 MG/ML IJ SOLN
1.0000 mg | Freq: Once | INTRAMUSCULAR | Status: AC
  Administered 2023-05-14: 1 mg via INTRAMUSCULAR
  Filled 2023-05-14: qty 1

## 2023-05-14 NOTE — ED Provider Triage Note (Signed)
Emergency Medicine Provider Triage Evaluation Note  Bonnie Farmer , a 57 y.o. female  was evaluated in triage.  Pt complains of severe left hip pain that radiates down the buttock and left leg.  Patient has history of RA and lupus.  She is on chronic pain management which helps some.  She states the pain became severe and no position is comfortable.  Denies injury, heavy lifting.   Review of Systems  Positive: As above Negative: As above  Physical Exam  BP (!) 116/93 (BP Location: Left Arm)   Pulse 71   Temp 98 F (36.7 C) (Oral)   Resp 16   SpO2 100%  Gen:   Awake, appears very uncomfortable, standing, tearful  Resp:  Normal effort  MSK:   Moves extremities without difficulty  Other:    Medical Decision Making  Medically screening exam initiated at 12:16 PM.  Appropriate orders placed.  Bonnie Farmer was informed that the remainder of the evaluation will be completed by another provider, this initial triage assessment does not replace that evaluation, and the importance of remaining in the ED until their evaluation is complete.     Melton Alar R, PA-C 05/14/23 1218

## 2023-05-14 NOTE — Discharge Instructions (Addendum)
Take the medications as prescribed to help with your sciatica pain.  Follow-up with your primary care doctor to be rechecked if the symptoms persist.  Return to the ED for fevers chills or other concerning symptoms

## 2023-05-14 NOTE — ED Triage Notes (Signed)
Patient here from home reporting almost 2 weeks of left sided hip pain radiating down left leg. Hx of lupus and chronic pain. Seeing pain management with relief. Ambulatory.

## 2023-05-14 NOTE — ED Provider Notes (Signed)
[place] EMERGENCY DEPARTMENT AT [facility] Provider Note   CSN: [id number] Arrival date & time: 05/14/23  1132     History  Chief Complaint  Patient presents with   Leg Pain   Hip Pain    Bonnie Farmer is a 57 y.o. female.   Leg Pain Hip Pain     Patient has a history of rheumatoid arthritis and lupus.  She presents to the ED with complaints of left hip pain.  Patient states she is also had trouble with sciatica in the past.  She is having pain that she attributes to a flare of sciatica.  She is having pain in the left buttock region and it radiates down her leg.  It has been ongoing for several days now.  Today was more severe.  She does take hydrocodone for her chronic pain and took some of that this morning.  It alleviated somewhat but she continues to have pain and discomfort.  She is not having numbness or weakness.  No recent falls or injuries.  No fevers or chills.  Home Medications Prior to Admission medications   Medication Sig Start Date End Date Taking? Authorizing Provider  lidocaine (LIDODERM) 5 % Place 1 patch onto the skin daily. Remove & Discard patch within 12 hours or as directed by MD 05/14/23  Yes Linwood Dibbles, MD  predniSONE (DELTASONE) 10 MG tablet Take 5 tablets (50 mg total) by mouth daily with breakfast for 2 days, THEN 4 tablets (40 mg total) daily with breakfast for 2 days, THEN 3 tablets (30 mg total) daily with breakfast for 2 days, THEN 2 tablets (20 mg total) daily with breakfast for 2 days, THEN 1 tablet (10 mg total) daily with breakfast for 2 days. 05/14/23 05/23/23 Yes Linwood Dibbles, MD  acetaminophen (TYLENOL) 500 MG tablet Take 2 tablets (1,000 mg total) by mouth every 8 (eight) hours as needed for mild pain or moderate pain (temperature > 101.5.). 06/10/22   Gerald Leitz, MD  albuterol (VENTOLIN HFA) 108 (90 Base) MCG/ACT inhaler Inhale 1-2 puffs into the lungs every 6 (six) hours as needed for wheezing or shortness of breath.  04/17/20   Parrett, Virgel Bouquet, NP  amLODipine (NORVASC) 10 MG tablet Take 1 tablet (10 mg total) by mouth daily. 05/08/21     BLACK COHOSH EXTRACT PO Take by mouth daily.    [provider]  EPINEPHrine 0.3 mg/0.3 mL IJ SOAJ injection Inject 0.3 mLs into the muscle once as needed for anaphylaxis. 05/25/19   [provider]  fexofenadine (ALLEGRA) 180 MG tablet Take 180 mg by mouth daily.    [provider]  fluticasone (FLONASE) 50 MCG/ACT nasal spray Place 1 spray into both nostrils daily.    [provider]  gabapentin (NEURONTIN) 300 MG capsule Take 1 capsule (300 mg total) by mouth at bedtime. 05/01/17   Nche, Bonna Gains, NP  ibuprofen (ADVIL) 600 MG tablet Take 1 tablet (600 mg total) by mouth every 6 (six) hours as needed. 05/14/23   Linwood Dibbles, MD  leflunomide (ARAVA) 20 MG tablet Take 1 tablet (20 mg total) by mouth daily. Patient taking differently: Take 20 mg by mouth at bedtime. 05/22/21     omeprazole (PRILOSEC) 20 MG capsule Take 2 capsules by mouth daily. 07/20/19   [provider]  oxyCODONE (OXY IR/ROXICODONE) 5 MG immediate release tablet Take 1-2 tablets (5-10 mg total) by mouth every 6 (six) hours as needed for moderate pain. 06/10/22  Gerald Leitz, MD  sertraline (ZOLOFT) 100 MG tablet Take 1 tablet (100 mg total) by mouth daily. 12/21/20     traZODone (DESYREL) 100 MG tablet Take 1-2 tablets (100-200 mg total) by mouth at bedtime. DO NOT TAKE MORE THAN 2 TABLETS DAILY 06/18/21     valACYclovir (VALTREX) 1000 MG tablet Take 1 tablet (1,000 mg total) by mouth daily. 04/09/21         Allergies    Peanuts [peanut oil], Soy allergy, Penicillins, and Sulfa antibiotics    Review of Systems   Review of Systems  Physical Exam Updated Vital Signs BP (!) 116/93 (BP Location: Left Arm)   Pulse 71   Temp 98 F (36.7 C) (Oral)   Resp 16   LMP 06/03/2022 (Exact Date)   SpO2 100%  Physical Exam Vitals and nursing note reviewed.   Constitutional:      General: She is not in acute distress.    Appearance: She is well-developed.  HENT:     Head: Normocephalic and atraumatic.     Right Ear: External ear normal.     Left Ear: External ear normal.  Eyes:     General: No scleral icterus.       Right eye: No discharge.        Left eye: No discharge.     Conjunctiva/sclera: Conjunctivae normal.  Neck:     Trachea: No tracheal deviation.  Cardiovascular:     Rate and Rhythm: Normal rate.  Pulmonary:     Effort: Pulmonary effort is normal. No respiratory distress.     Breath sounds: No stridor.  Abdominal:     General: There is no distension.  Musculoskeletal:        General: Tenderness present. No swelling or deformity.     Cervical back: Neck supple.     Comments: Tenderness palpation left buttock, pain with range of motion, no swelling, no induration  Skin:    General: Skin is warm and dry.     Findings: No rash.  Neurological:     Mental Status: She is alert. Mental status is at baseline.     Cranial Nerves: No dysarthria or facial asymmetry.     Motor: No seizure activity.     Comments: Distal neurovascular intact, normal strength and sensation     ED Results / Procedures / Treatments   Labs (all labs ordered are listed, but only abnormal results are displayed) Labs Reviewed - No data to display  EKG None  Radiology No results found.  Procedures Procedures    Medications Ordered in ED Medications  HYDROmorphone (DILAUDID) injection 1 mg (1 mg Intramuscular Given 05/14/23 1302)  ketorolac (TORADOL) 30 MG/ML injection 30 mg (30 mg Intramuscular Given 05/14/23 1302)    ED Course/ Medical Decision Making/ A&P Clinical Course as of 05/14/23 1419  Thu May 14, 2023  1241 Monthly hydrocodone Rx noted on PDMP last fill August 16 [JK]  1337 Patient states she is feeling better after medications. [JK]  1404 X-rays reviewed.  No fracture or dislocation noted [JK]    Clinical Course User  Index [JK] Linwood Dibbles, MD                                 Medical Decision Making Problems Addressed: Sciatica of left side: acute illness or injury that poses a threat to life or bodily functions  Amount and/or Complexity of Data Reviewed Radiology: ordered  and independent interpretation performed.  Risk Prescription drug management.   Patient presented to ED with complaints of left-sided leg and hip pain suggestive of sciatica.  Patient was treated with medication in the ED with significant improvement of her pain.  X-rays do not show any signs of fracture by my interpretation.  Formal radiology interpretation is still pending.  Patient has been waiting for a couple of hours now and would prefer to leave.  Explained to her that the formal results should be available later today.  I have low suspicion for fracture not noted on my review of the x-ray.  Evaluation and diagnostic testing in the emergency department does not suggest an emergent condition requiring admission or immediate intervention beyond what has been performed at this time.  The patient is safe for discharge and has been instructed to return immediately for worsening symptoms, change in symptoms or any other concerns.         Final Clinical Impression(s) / ED Diagnoses Final diagnoses:  Sciatica of left side    Rx / DC Orders ED Discharge Orders          Ordered    predniSONE (DELTASONE) 10 MG tablet  Q breakfast        05/14/23 1417    lidocaine (LIDODERM) 5 %  Every 24 hours        05/14/23 1417    ibuprofen (ADVIL) 600 MG tablet  Every 6 hours PRN        05/14/23 1417              Linwood Dibbles, MD 05/14/23 1419
